# Patient Record
Sex: Male | Born: 1965
Health system: Southern US, Community
[De-identification: ages and names within clinical notes are randomized; demographics above are authoritative.]

## PROBLEM LIST (undated history)

## (undated) DIAGNOSIS — N189 Chronic kidney disease, unspecified: Secondary | ICD-10-CM

## (undated) DIAGNOSIS — N200 Calculus of kidney: Secondary | ICD-10-CM

## (undated) HISTORY — PX: ARM WOUND REPAIR / CLOSURE: SUR1141

## (undated) HISTORY — PX: FRACTURE SURGERY: SHX138

## (undated) HISTORY — DX: Calculus of kidney: N20.0

---

## 1999-01-31 ENCOUNTER — Encounter: Payer: Self-pay | Admitting: Emergency Medicine

## 1999-01-31 ENCOUNTER — Emergency Department (HOSPITAL_COMMUNITY): Admission: EM | Admit: 1999-01-31 | Discharge: 1999-01-31 | Payer: Self-pay | Admitting: Emergency Medicine

## 2006-02-10 ENCOUNTER — Ambulatory Visit: Payer: Self-pay | Admitting: Internal Medicine

## 2006-02-11 ENCOUNTER — Inpatient Hospital Stay: Payer: Self-pay | Admitting: Urology

## 2006-07-16 ENCOUNTER — Emergency Department: Payer: Self-pay | Admitting: Internal Medicine

## 2006-07-26 ENCOUNTER — Inpatient Hospital Stay: Payer: Self-pay | Admitting: Urology

## 2006-07-29 ENCOUNTER — Ambulatory Visit: Payer: Self-pay | Admitting: Urology

## 2007-08-11 ENCOUNTER — Ambulatory Visit: Payer: Self-pay | Admitting: Family Medicine

## 2007-12-17 IMAGING — CR DG ABDOMEN 1V
1 series · 1 of 1 positions shown · non-contrast
Comparison: none

REASON FOR EXAM: Kidney stones
COMMENTS:

[view not recorded]
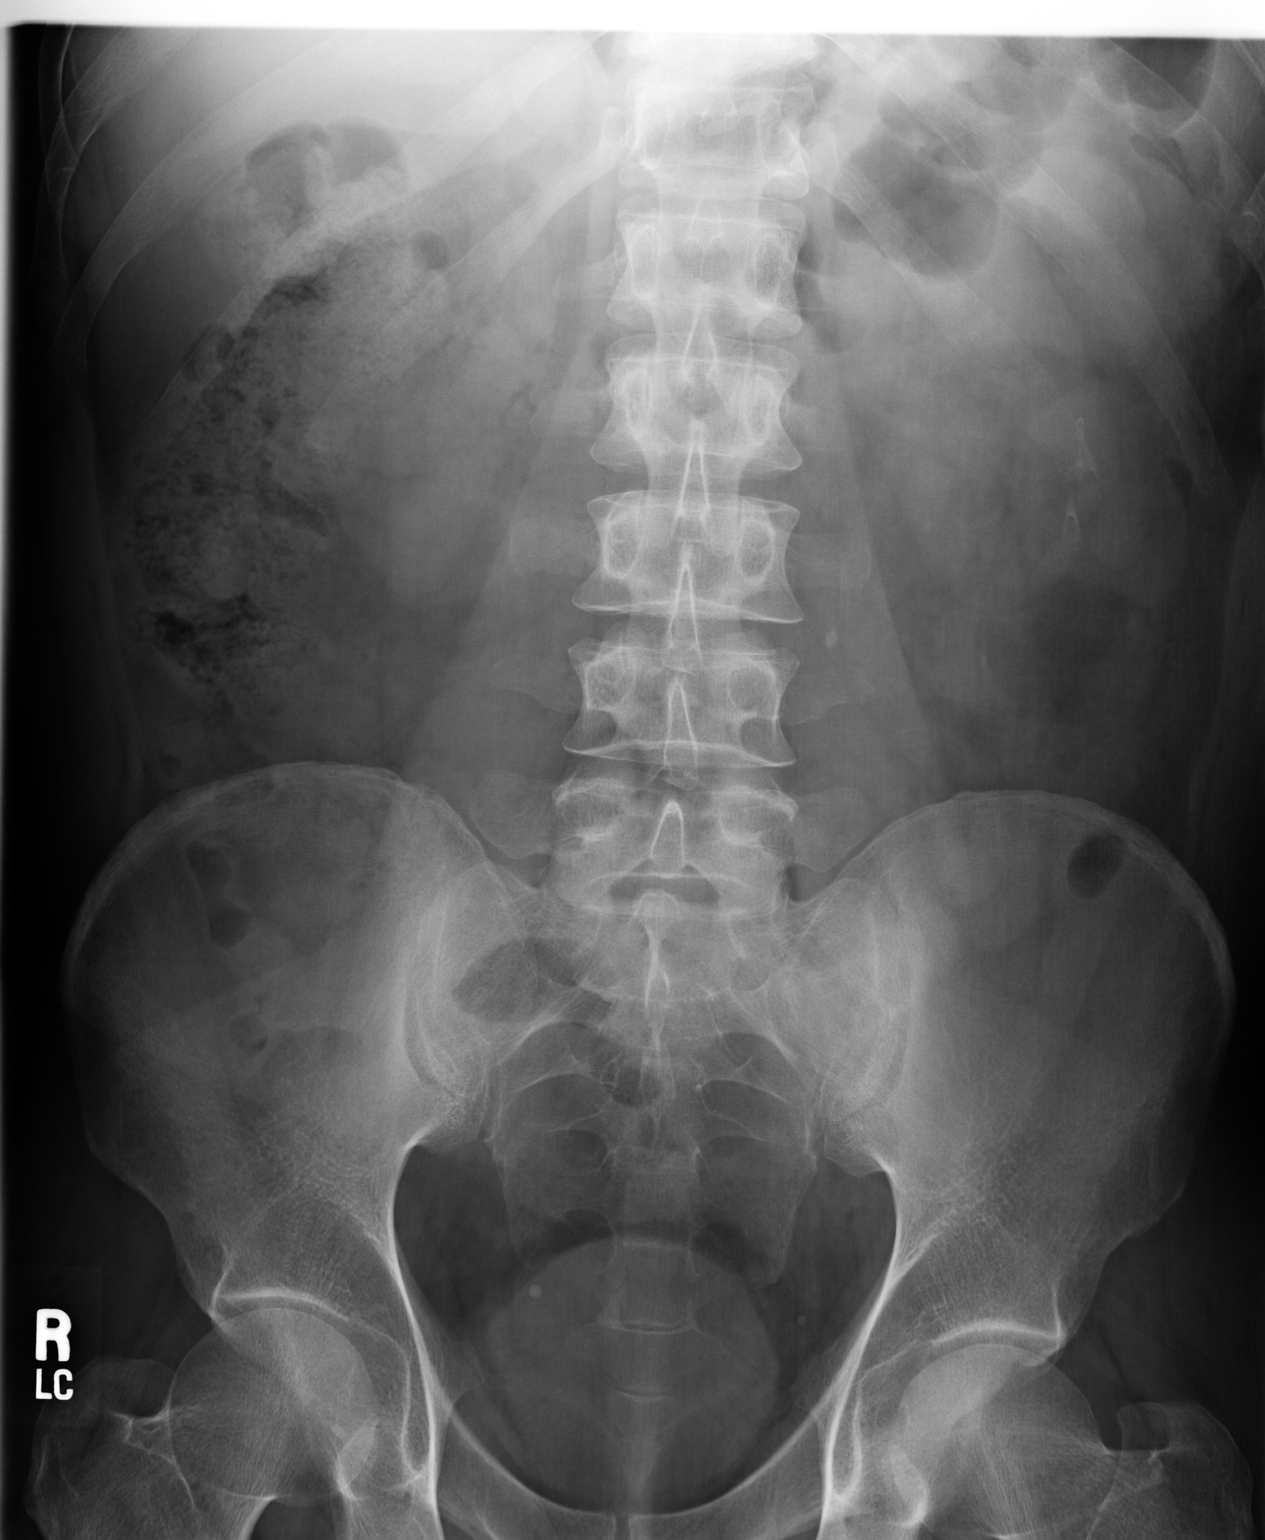

[1 of 1 positions shown; findings below may reference images not displayed]

PROCEDURE:     DXR - DXR KIDNEY URETER BLADDER  - July 29, 2006  [DATE]

RESULT:     Comparison is made to the prior exam of 07/26/2006. There is
again noted to a 3 mm calcification which on this exam is projected inferior
to the L3 transverse process on the left and therefore is slightly lower in
position than on the prior exam. No additional renal or ureteral
calcifications are seen. There is again noted a round calcification in the
right pelvis consistent with a phlebolith.
IMPRESSION: Please see above.

## 2008-12-25 ENCOUNTER — Observation Stay: Payer: Self-pay | Admitting: Urology

## 2009-06-01 HISTORY — PX: OTHER SURGICAL HISTORY: SHX169

## 2010-11-04 ENCOUNTER — Emergency Department: Payer: Self-pay | Admitting: Emergency Medicine

## 2015-05-02 ENCOUNTER — Ambulatory Visit
Admission: RE | Admit: 2015-05-02 | Discharge: 2015-05-02 | Disposition: A | Discharge status: Home / Self Care | Payer: BLUE CROSS/BLUE SHIELD | Source: Ambulatory Visit | Attending: Urology | Admitting: Urology

## 2015-05-02 ENCOUNTER — Encounter: Payer: Self-pay | Admitting: *Deleted

## 2015-05-02 ENCOUNTER — Encounter
Admission: RE | Disposition: A | Discharge status: Home / Self Care | Payer: Self-pay | Source: Ambulatory Visit | Attending: Urology

## 2015-05-02 DIAGNOSIS — Z87442 Personal history of urinary calculi: Secondary | ICD-10-CM | POA: Insufficient documentation

## 2015-05-02 DIAGNOSIS — Z9889 Other specified postprocedural states: Secondary | ICD-10-CM | POA: Insufficient documentation

## 2015-05-02 DIAGNOSIS — Z8249 Family history of ischemic heart disease and other diseases of the circulatory system: Secondary | ICD-10-CM | POA: Insufficient documentation

## 2015-05-02 DIAGNOSIS — N200 Calculus of kidney: Secondary | ICD-10-CM

## 2015-05-02 DIAGNOSIS — Z833 Family history of diabetes mellitus: Secondary | ICD-10-CM | POA: Diagnosis not present

## 2015-05-02 DIAGNOSIS — Z79899 Other long term (current) drug therapy: Secondary | ICD-10-CM | POA: Diagnosis not present

## 2015-05-02 HISTORY — PX: EXTRACORPOREAL SHOCK WAVE LITHOTRIPSY: SHX1557

## 2015-05-02 SURGERY — LITHOTRIPSY, ESWL
Anesthesia: Moderate Sedation | Laterality: Right

## 2015-05-02 MED ORDER — SODIUM CHLORIDE 0.9 % IJ SOLN
INTRAMUSCULAR | Status: AC
  Filled 2015-05-02: qty 10

## 2015-05-02 MED ORDER — PROMETHAZINE HCL 25 MG/ML IJ SOLN
INTRAMUSCULAR | Status: AC
  Administered 2015-05-02: 12.5 mg via INTRAVENOUS
  Filled 2015-05-02: qty 1

## 2015-05-02 MED ORDER — PROMETHAZINE HCL 25 MG/ML IJ SOLN
25.0000 mg | Freq: Once | INTRAMUSCULAR | Status: AC
  Administered 2015-05-02: 25 mg via INTRAMUSCULAR

## 2015-05-02 MED ORDER — HYDROMORPHONE HCL 1 MG/ML IJ SOLN
INTRAMUSCULAR | Status: AC
  Administered 2015-05-02: 1 mg via INTRAVENOUS
  Filled 2015-05-02: qty 1

## 2015-05-02 MED ORDER — MIDAZOLAM HCL 2 MG/2ML IJ SOLN
INTRAMUSCULAR | Status: AC
  Filled 2015-05-02: qty 2

## 2015-05-02 MED ORDER — MORPHINE SULFATE (PF) 10 MG/ML IV SOLN
10.0000 mg | Freq: Once | INTRAVENOUS | Status: AC
  Administered 2015-05-02: 10 mg via INTRAMUSCULAR

## 2015-05-02 MED ORDER — DEXTROSE-NACL 5-0.45 % IV SOLN
Freq: Once | INTRAVENOUS | Status: AC
  Administered 2015-05-02: 10:00:00 via INTRAVENOUS

## 2015-05-02 MED ORDER — MORPHINE SULFATE (PF) 10 MG/ML IV SOLN
INTRAVENOUS | Status: AC
  Filled 2015-05-02: qty 1

## 2015-05-02 MED ORDER — HYDROMORPHONE HCL 1 MG/ML IJ SOLN
1.0000 mg | Freq: Once | INTRAMUSCULAR | Status: AC
  Administered 2015-05-02: 1 mg via INTRAVENOUS

## 2015-05-02 MED ORDER — PROMETHAZINE HCL 25 MG/ML IJ SOLN
12.5000 mg | Freq: Once | INTRAMUSCULAR | Status: AC
  Administered 2015-05-02: 12.5 mg via INTRAVENOUS

## 2015-05-02 MED ORDER — TAMSULOSIN HCL 0.4 MG PO CAPS: 0.4000 mg | ORAL_CAPSULE | Freq: Every day | ORAL | Status: DC

## 2015-05-02 MED ORDER — LEVOFLOXACIN 500 MG PO TABS: 500.0000 mg | ORAL_TABLET | Freq: Every day | ORAL | Status: DC

## 2015-05-02 MED ORDER — DIPHENHYDRAMINE HCL 25 MG PO CAPS
25.0000 mg | ORAL_CAPSULE | ORAL | Status: AC
  Administered 2015-05-02: 25 mg via ORAL

## 2015-05-02 MED ORDER — FUROSEMIDE 10 MG/ML IJ SOLN
INTRAMUSCULAR | Status: AC
  Administered 2015-05-02: 10 mg via INTRAVENOUS
  Filled 2015-05-02: qty 2

## 2015-05-02 MED ORDER — FUROSEMIDE 10 MG/ML IJ SOLN
10.0000 mg | Freq: Once | INTRAMUSCULAR | Status: AC
  Administered 2015-05-02: 10 mg via INTRAVENOUS

## 2015-05-02 MED ORDER — ONDANSETRON 8 MG PO TBDP: 8.0000 mg | ORAL_TABLET | Freq: Four times a day (QID) | ORAL | Status: DC | PRN

## 2015-05-02 MED ORDER — DIPHENHYDRAMINE HCL 25 MG PO CAPS
ORAL_CAPSULE | ORAL | Status: AC
  Filled 2015-05-02: qty 1

## 2015-05-02 MED ORDER — LEVOFLOXACIN 500 MG PO TABS
500.0000 mg | ORAL_TABLET | Freq: Once | ORAL | Status: AC
  Administered 2015-05-02: 500 mg via ORAL

## 2015-05-02 MED ORDER — MIDAZOLAM HCL 2 MG/2ML IJ SOLN
1.0000 mg | Freq: Once | INTRAMUSCULAR | Status: AC
  Administered 2015-05-02: 1 mg via INTRAMUSCULAR

## 2015-05-02 MED ORDER — PROMETHAZINE HCL 25 MG/ML IJ SOLN
INTRAMUSCULAR | Status: AC
  Filled 2015-05-02: qty 1

## 2015-05-02 MED ORDER — NUCYNTA 50 MG PO TABS: 50.0000 mg | ORAL_TABLET | Freq: Four times a day (QID) | ORAL | Status: DC | PRN

## 2015-05-02 MED ORDER — LEVOFLOXACIN 500 MG PO TABS
ORAL_TABLET | ORAL | Status: AC
  Filled 2015-05-02: qty 1

## 2015-05-02 NOTE — Progress Notes (Signed)
Pt getting ready to go home with repeat N/V and right flank stabbing pain but states is somewhat better. Dr Yves Dill aware with orders for phenergan and dilaudid given.

## 2015-05-02 NOTE — Discharge Instructions (Addendum)
Dietary Guidelines to Help Prevent Kidney Stones °Your risk of kidney stones can be decreased by adjusting the foods you eat. The most important thing you can do is drink enough fluid. You should drink enough fluid to keep your urine clear or pale yellow. The following guidelines provide specific information for the type of kidney stone you have had. °GUIDELINES ACCORDING TO TYPE OF KIDNEY STONE °Calcium Oxalate Kidney Stones °· Reduce the amount of salt you eat. Foods that have a lot of salt cause your body to release excess calcium into your urine. The excess calcium can combine with a substance called oxalate to form kidney stones. °· Reduce the amount of animal protein you eat if the amount you eat is excessive. Animal protein causes your body to release excess calcium into your urine. Ask your dietitian how much protein from animal sources you should be eating. °· Avoid foods that are high in oxalates. If you take vitamins, they should have less than 500 mg of vitamin C. Your body turns vitamin C into oxalates. You do not need to avoid fruits and vegetables high in vitamin C. °Calcium Phosphate Kidney Stones °· Reduce the amount of salt you eat to help prevent the release of excess calcium into your urine. °· Reduce the amount of animal protein you eat if the amount you eat is excessive. Animal protein causes your body to release excess calcium into your urine. Ask your dietitian how much protein from animal sources you should be eating. °· Get enough calcium from food or take a calcium supplement (ask your dietitian for recommendations). Food sources of calcium that do not increase your risk of kidney stones include: °· Broccoli. °· Dairy products, such as cheese and yogurt. °· Pudding. °Uric Acid Kidney Stones °· Do not have more than 6 oz of animal protein per day. °FOOD SOURCES °Animal Protein Sources °· Meat (all types). °· Poultry. °· Eggs. °· Fish, seafood. °Foods High in Salt °· Salt seasonings. °· Soy  sauce. °· Teriyaki sauce. °· Cured and processed meats. °· Salted crackers and snack foods. °· Fast food. °· Canned soups and most canned foods. °Foods High in Oxalates °· Grains: °· Amaranth. °· Barley. °· Grits. °· Wheat germ. °· Bran. °· Buckwheat flour. °· All bran cereals. °· Pretzels. °· Whole wheat bread. °· Vegetables: °· Beans (wax). °· Beets and beet greens. °· Collard greens. °· Eggplant. °· Escarole. °· Leeks. °· Okra. °· Parsley. °· Rutabagas. °· Spinach. °· Swiss chard. °· Tomato paste. °· Fried potatoes. °· Sweet potatoes. °· Fruits: °· Red currants. °· Figs. °· Kiwi. °· Rhubarb. °· Meat and Other Protein Sources: °· Beans (dried). °· Soy burgers and other soybean products. °· Miso. °· Nuts (peanuts, almonds, pecans, cashews, hazelnuts). °· Nut butters. °· Sesame seeds and tahini (paste made of sesame seeds). °· Poppy seeds. °· Beverages: °· Chocolate drink mixes. °· Soy milk. °· Instant iced tea. °· Juices made from high-oxalate fruits or vegetables. °· Other: °· Carob. °· Chocolate. °· Fruitcake. °· Marmalades. °  °This information is not intended to replace advice given to you by your health care provider. Make sure you discuss any questions you have with your health care provider. °  °Document Released: 09/12/2010 Document Revised: 05/23/2013 Document Reviewed: 04/14/2013 °Elsevier Interactive Patient Education ©2016 Elsevier Inc. ° °Kidney Stones °Kidney stones (urolithiasis) are deposits that form inside your kidneys. The intense pain is caused by the stone moving through the urinary tract. When the stone moves, the ureter   goes into spasm around the stone. The stone is usually passed in the urine.  °CAUSES  °· A disorder that makes certain neck glands produce too much parathyroid hormone (primary hyperparathyroidism). °· A buildup of uric acid crystals, similar to gout in your joints. °· Narrowing (stricture) of the ureter. °· A kidney obstruction present at birth (congenital  obstruction). °· Previous surgery on the kidney or ureters. °· Numerous kidney infections. °SYMPTOMS  °· Feeling sick to your stomach (nauseous). °· Throwing up (vomiting). °· Blood in the urine (hematuria). °· Pain that usually spreads (radiates) to the groin. °· Frequency or urgency of urination. °DIAGNOSIS  °· Taking a history and physical exam. °· Blood or urine tests. °· CT scan. °· Occasionally, an examination of the inside of the urinary bladder (cystoscopy) is performed. °TREATMENT  °· Observation. °· Increasing your fluid intake. °· Extracorporeal shock wave lithotripsy--This is a noninvasive procedure that uses shock waves to break up kidney stones. °· Surgery may be needed if you have severe pain or persistent obstruction. There are various surgical procedures. Most of the procedures are performed with the use of small instruments. Only small incisions are needed to accommodate these instruments, so recovery time is minimized. °The size, location, and chemical composition are all important variables that will determine the proper choice of action for you. Talk to your health care provider to better understand your situation so that you will minimize the risk of injury to yourself and your kidney.  °HOME CARE INSTRUCTIONS  °· Drink enough water and fluids to keep your urine clear or pale yellow. This will help you to pass the stone or stone fragments. °· Strain all urine through the provided strainer. Keep all particulate matter and stones for your health care provider to see. The stone causing the pain may be as small as a grain of salt. It is very important to use the strainer each and every time you pass your urine. The collection of your stone will allow your health care provider to analyze it and verify that a stone has actually passed. The stone analysis will often identify what you can do to reduce the incidence of recurrences. °· Only take over-the-counter or prescription medicines for pain,  discomfort, or fever as directed by your health care provider. °· Keep all follow-up visits as told by your health care provider. This is important. °· Get follow-up X-rays if required. The absence of pain does not always mean that the stone has passed. It may have only stopped moving. If the urine remains completely obstructed, it can cause loss of kidney function or even complete destruction of the kidney. It is your responsibility to make sure X-rays and follow-ups are completed. Ultrasounds of the kidney can show blockages and the status of the kidney. Ultrasounds are not associated with any radiation and can be performed easily in a matter of minutes. °· Make changes to your daily diet as told by your health care provider. You may be told to: °· Limit the amount of salt that you eat. °· Eat 5 or more servings of fruits and vegetables each day. °· Limit the amount of meat, poultry, fish, and eggs that you eat. °· Collect a 24-hour urine sample as told by your health care provider. You may need to collect another urine sample every 6-12 months. °SEEK MEDICAL CARE IF: °· You experience pain that is progressive and unresponsive to any pain medicine you have been prescribed. °SEEK IMMEDIATE MEDICAL CARE IF:  °· Pain   cannot be controlled with the prescribed medicine. °· You have a fever or shaking chills. °· The severity or intensity of pain increases over 18 hours and is not relieved by pain medicine. °· You develop a new onset of abdominal pain. °· You feel faint or pass out. °· You are unable to urinate. °  °This information is not intended to replace advice given to you by your health care provider. Make sure you discuss any questions you have with your health care provider. °  °Document Released: 05/18/2005 Document Revised: 02/06/2015 Document Reviewed: 10/19/2012 °Elsevier Interactive Patient Education ©2016 Elsevier Inc. ° °Lithotripsy, Care After °Refer to this sheet in the next few weeks. These instructions  provide you with information on caring for yourself after your procedure. Your health care provider may also give you more specific instructions. Your treatment has been planned according to current medical practices, but problems sometimes occur. Call your health care provider if you have any problems or questions after your procedure. °WHAT TO EXPECT AFTER THE PROCEDURE  °· Your urine may have a red tinge for a few days after treatment. Blood loss is usually minimal. °· You may have soreness in the back or flank area. This usually goes away after a few days. The procedure can cause blotches or bruises on the back where the pressure wave enters the skin. These marks usually cause only minimal discomfort and should disappear in a short time. °· Stone fragments should begin to pass within 24 hours of treatment. However, a delayed passage is not unusual. °· You may have pain, discomfort, and feel sick to your stomach (nauseated) when the crushed fragments of stone are passed down the tube from the kidney to the bladder. Stone fragments can pass soon after the procedure and may last for up to 4-8 weeks. °· A small number of patients may have severe pain when stone fragments are not able to pass, which leads to an obstruction. °· If your stone is greater than 1 inch (2.5 cm) in diameter or if you have multiple stones that have a combined diameter greater than 1 inch (2.5 cm), you may require more than one treatment. °· If you had a stent placed prior to your procedure, you may experience some discomfort, especially during urination. You may experience the pain or discomfort in your flank or back, or you may experience a sharp pain or discomfort at the base of your penis or in your lower abdomen. The discomfort usually lasts only a few minutes after urinating. °HOME CARE INSTRUCTIONS  °· Rest at home until you feel your energy improving. °· Only take over-the-counter or prescription medicines for pain, discomfort, or  fever as directed by your health care provider. Depending on the type of lithotripsy, you may need to take antibiotics and anti-inflammatory medicines for a few days. °· Drink enough water and fluids to keep your urine clear or pale yellow. This helps "flush" your kidneys. It helps pass any remaining pieces of stone and prevents stones from coming back. °· Most people can resume daily activities within 1-2 days after standard lithotripsy. It can take longer to recover from laser and percutaneous lithotripsy. °· Strain all urine through the provided strainer. Keep all particulate matter and stones for your health care provider to see. The stone may be as small as a grain of salt. It is very important to use the strainer each and every time you pass your urine. Any stones that are found can be sent to   a medical lab for examination.  Visit your health care provider for a follow-up appointment in a few weeks. Your doctor may remove your stent if you have one. Your health care provider will also check to see whether stone particles still remain. SEEK MEDICAL CARE IF:   Your pain is not relieved by medicine.  You have a lasting nauseous feeling.  You feel there is too much blood in the urine.  You develop persistent problems with frequent or painful urination that does not at least partially improve after 2 days following the procedure.  You have a congested cough.  You feel lightheaded.  You develop a rash or any other signs that might suggest an allergic problem.  You develop any reaction or side effects to your medicine(s). SEEK IMMEDIATE MEDICAL CARE IF:   You experience severe back or flank pain or both.  You see nothing but blood when you urinate.  You cannot pass any urine at all.  You have a fever or shaking chills.  You develop shortness of breath, difficulty breathing, or chest pain.  You develop vomiting that will not stop after 6-8 hours.  You have a fainting episode.   This  information is not intended to replace advice given to you by your health care provider. Make sure you discuss any questions you have with your health care provider.   Document Released: 06/07/2007 Document Revised: 02/06/2015 Document Reviewed: 12/01/2012 Elsevier Interactive Patient Education [employer].  Lithotripsy Lithotripsy is a treatment that can sometimes help eliminate kidney stones and pain that they cause. A form of lithotripsy, also known as extracorporeal shock wave lithotripsy, is a nonsurgical procedure that helps your body rid itself of the kidney stone when it is too big to pass on its own. Extracorporeal shock wave lithotripsy is a method of crushing a kidney stone with shock waves. These shock waves pass through your body and are focused on your stone. They cause the kidney stones to crumble while still in the urinary tract. It is then easier for the smaller pieces of stone to pass in the urine. Lithotripsy usually takes about an hour. It is done in a hospital, a lithotripsy center, or a mobile unit. It usually does not require an overnight stay. Your health care provider will instruct you on preparation for the procedure. Your health care provider will tell you what to expect afterward. LET [facility] CARE PROVIDER KNOW ABOUT:  Any allergies you have.  All medicines you are taking, including vitamins, herbs, eye drops, creams, and over-the-counter medicines.  Previous problems you or members of your family have had with the use of anesthetics.  Any blood disorders you have.  Previous surgeries you have had.  Medical conditions you have. RISKS AND COMPLICATIONS Generally, lithotripsy for kidney stones is a safe procedure. However, as with any procedure, complications can occur. Possible complications include:  Infection.  Bleeding of the kidney.  Bruising of the kidney or skin.  Obstruction of the ureter.  Failure of the stone to fragment. BEFORE THE  PROCEDURE  Do not eat or drink for 6-8 hours prior to the procedure. You may, however, take the medications with a sip of water that your physician instructs you to take  Do not take aspirin or aspirin-containing products for 7 days prior to your procedure  Do not take nonsteroidal anti-inflammatory products for 7 days prior to your procedure PROCEDURE A stent (flexible tube with holes) may be placed in your ureter. The ureter is  the tube that transports the urine from the kidneys to the bladder. Your health care provider may place a stent before the procedure. This will help keep urine flowing from the kidney if the fragments of the stone block the ureter. You may have an IV tube placed in one of your veins to give you fluids and medicines. These medicines may help you relax or make you sleep. During the procedure, you will lie comfortably on a fluid-filled cushion or in a warm-water bath. After an X-ray or ultrasound exam to locate your stone, shock waves are aimed at the stone. If you are awake, you may feel a tapping sensation as the shock waves pass through your body. If large stone particles remain after treatment, a second procedure may be necessary at a later date. °For comfort during the test: °· Relax as much as possible. °· Try to remain still as much as possible. °· Try to follow instructions to speed up the test. °· Let your health care provider know if you are uncomfortable, anxious, or in pain. °AFTER THE PROCEDURE  °After surgery, you will be taken to the recovery area. A nurse will watch and check your progress. Once you're awake, stable, and taking fluids well, you will be allowed to go home as long as there are no problems. You will also be allowed to pass your urine before discharge. You may be given antibiotics to help prevent infection. You may also be prescribed pain medicine if needed. In a week or two, your health care provider may remove your stent, if you have one. You may first  have an X-ray exam to check on how successful the fragmentation of your stone has been and how much of the stone has passed. Your health care provider will check to see whether or not stone particles remain. °SEEK IMMEDIATE MEDICAL CARE IF: °· You develop a fever or shaking chills. °· Your pain is not relieved by medicine. °· You feel sick to your stomach (nauseated) and you vomit. °· You develop heavy bleeding. °· You have difficulty urinating. °· You start to pass your stent from your penis. °  °This information is not intended to replace advice given to you by your health care provider. Make sure you discuss any questions you have with your health care provider. °  °Document Released: 05/15/2000 Document Revised: 06/08/2014 Document Reviewed: 12/01/2012 °Elsevier Interactive Patient Education ©2016 Elsevier Inc. ° °Renal Colic °Renal colic is pain that is caused by passing a kidney stone. The pain can be sharp and severe. It may be felt in the back, abdomen, side (flank), or groin. It can cause nausea. Renal colic can come and go. °HOME CARE INSTRUCTIONS °Watch your condition for any changes. The following actions may help to lessen any discomfort that you are feeling: °· Take medicines only as directed by your health care provider. °· Ask your health care provider if it is okay to take over-the-counter pain medicine. °· Drink enough fluid to keep your urine clear or pale yellow. Drink 6-8 glasses of water each day. °· Limit the amount of salt that you eat to less than 2 grams per day. °· Reduce the amount of protein in your diet. Eat less meat, fish, nuts, and dairy. °· Avoid foods such as spinach, rhubarb, nuts, or bran. These may make kidney stones more likely to form. °SEEK MEDICAL CARE IF: °· You have a fever or chills. °· Your urine smells bad or looks cloudy. °· You have pain or   burning when you pass urine. SEEK IMMEDIATE MEDICAL CARE IF:  Your flank pain or groin pain suddenly worsens.  You become  confused or disoriented or you lose consciousness.   This information is not intended to replace advice given to you by your health care provider. Make sure you discuss any questions you have with your health care provider.   Document Released: 02/25/2005 Document Revised: 06/08/2014 Document Reviewed: 03/28/2014 Elsevier Interactive Patient Education 2016 [place]   1) The drugs that you were given will stay in your system until tomorrow so for the next 24 hours you should not:  A) Drive an automobile B) Make any legal decisions C) Drink any alcoholic beverage   2) You may resume regular meals tomorrow.  Today it is better to start with liquids and gradually work up to solid foods.  You may eat anything you prefer, but it is better to start with liquids, then soup and crackers, and gradually work up to solid foods.   3) Please notify your doctor immediately if you have any unusual bleeding, trouble breathing, redness and pain at the surgery site, drainage, fever, or pain not relieved by medication.    4) Additional Instructions:        Please contact your physician with any problems or Same Day Surgery at [phone], Monday through Friday 6 am to 4 pm, or [place] at [facility] number at [phone].AMBULATORY SURGERY  DISCHARGE INSTRUCTIONS   5) The drugs that you were given will stay in your system until tomorrow so for the next 24 hours you should not:  D) Drive an automobile E) Make any legal decisions F) Drink any alcoholic beverage   6) You may resume regular meals tomorrow.  Today it is better to start with liquids and gradually work up to solid foods.  You may eat anything you prefer, but it is better to start with liquids, then soup and crackers, and gradually work up to solid foods.   7) Please notify your doctor immediately if you have any unusual bleeding, trouble breathing, redness and pain at  the surgery site, drainage, fever, or pain not relieved by medication.    8) Additional Instructions:        Please contact your physician with any problems or Same Day Surgery at [phone], Monday through Friday 6 am to 4 pm, or [place] at [facility] number at [phone].

## 2015-05-03 ENCOUNTER — Encounter: Payer: Self-pay | Admitting: Urology

## 2015-05-06 ENCOUNTER — Encounter: Payer: Self-pay | Admitting: *Deleted

## 2015-05-06 DIAGNOSIS — N202 Calculus of kidney with calculus of ureter: Secondary | ICD-10-CM | POA: Diagnosis not present

## 2015-05-06 DIAGNOSIS — N201 Calculus of ureter: Secondary | ICD-10-CM | POA: Diagnosis present

## 2015-05-06 DIAGNOSIS — R509 Fever, unspecified: Secondary | ICD-10-CM | POA: Diagnosis not present

## 2015-05-06 NOTE — Patient Instructions (Signed)
  Your procedure is scheduled on: 05/07/15 3 PM Report to Day Surgery. [facility]ICAL [facility] SECOND FLOOR   Remember: Instructions that are not followed completely may result in serious medical risk, up to and including death, or upon the discretion of your surgeon and anesthesiologist your surgery may need to be rescheduled.    __X__ 1. Do not eat food or drink liquids after midnight. No gum chewing or hard candies.     _X___ 2. No Alcohol for 24 hours before or after surgery.   ____ 3. Bring all medications with you on the day of surgery if instructed.    _X___ 4. Notify your doctor if there is any change in your medical condition     (cold, fever, infections).     Do not wear jewelry, make-up, hairpins, clips or nail polish.  Do not wear lotions, powders, or perfumes. You may wear deodorant.  Do not shave 48 hours prior to surgery. Men may shave face and neck.  Do not bring valuables to the hospital.    [facility] is not responsible for any belongings or valuables.               Contacts, dentures or bridgework may not be worn into surgery.  Leave your suitcase in the car. After surgery it may be brought to your room.  For patients admitted to the hospital, discharge time is determined by your                treatment team.   Patients discharged the day of surgery will not be allowed to drive home.   Please read over the following fact sheets that you were given:   Surgical Site Infection Prevention   ____ Take these medicines the morning of surgery with A SIP OF WATER:    1. NONE  2.   3.   4.  5.  6.  ____ Fleet Enema (as directed)   ____ Use CHG Soap as directed  ____ Use inhalers on the day of surgery  ____ Stop metformin 2 days prior to surgery    ____ Take 1/2 of usual insulin dose the night before surgery and none on the morning of surgery.   ____ Stop Coumadin/Plavix/aspirin on  ____ Stop Anti-inflammatories on   ____ Stop supplements until after surgery.     ____ Bring C-Pap to the hospital.

## 2015-05-07 ENCOUNTER — Encounter
Admission: RE | Disposition: A | Discharge status: Home / Self Care | Payer: Self-pay | Source: Ambulatory Visit | Attending: Urology

## 2015-05-07 ENCOUNTER — Encounter: Payer: Self-pay | Admitting: *Deleted

## 2015-05-07 ENCOUNTER — Ambulatory Visit
Admission: RE | Admit: 2015-05-07 | Discharge: 2015-05-07 | Disposition: A | Discharge status: Home / Self Care | Payer: BLUE CROSS/BLUE SHIELD | Source: Ambulatory Visit | Attending: Urology | Admitting: Urology

## 2015-05-07 ENCOUNTER — Ambulatory Visit: Payer: BLUE CROSS/BLUE SHIELD | Admitting: Anesthesiology

## 2015-05-07 DIAGNOSIS — N202 Calculus of kidney with calculus of ureter: Secondary | ICD-10-CM | POA: Diagnosis not present

## 2015-05-07 DIAGNOSIS — R509 Fever, unspecified: Secondary | ICD-10-CM | POA: Insufficient documentation

## 2015-05-07 HISTORY — PX: URETEROSCOPY WITH HOLMIUM LASER LITHOTRIPSY: SHX6645

## 2015-05-07 HISTORY — DX: Chronic kidney disease, unspecified: N18.9

## 2015-05-07 SURGERY — URETEROSCOPY, WITH LITHOTRIPSY USING HOLMIUM LASER
Anesthesia: General | Site: Ureter | Laterality: Right | Wound class: Clean Contaminated

## 2015-05-07 MED ORDER — FENTANYL CITRATE (PF) 100 MCG/2ML IJ SOLN: 25.0000 ug | INTRAMUSCULAR | Status: DC | PRN

## 2015-05-07 MED ORDER — DOCUSATE SODIUM 100 MG PO CAPS: 200.0000 mg | ORAL_CAPSULE | Freq: Two times a day (BID) | ORAL | Status: DC

## 2015-05-07 MED ORDER — LIDOCAINE HCL (CARDIAC) 20 MG/ML IV SOLN
INTRAVENOUS | Status: DC | PRN
  Administered 2015-05-07: 60 mg via INTRAVENOUS

## 2015-05-07 MED ORDER — HYDROMORPHONE BOLUS VIA INFUSION
1.0000 mg | Freq: Once | INTRAVENOUS | Status: DC
  Filled 2015-05-07: qty 1

## 2015-05-07 MED ORDER — GENTAMICIN IN SALINE 1.6-0.9 MG/ML-% IV SOLN
80.0000 mg | INTRAVENOUS | Status: AC
  Administered 2015-05-07: 80 mg via INTRAVENOUS
  Filled 2015-05-07: qty 50

## 2015-05-07 MED ORDER — URIBEL 118 MG PO CAPS: 1.0000 | ORAL_CAPSULE | Freq: Four times a day (QID) | ORAL | Status: DC | PRN

## 2015-05-07 MED ORDER — HYDROMORPHONE HCL 1 MG/ML IJ SOLN
INTRAMUSCULAR | Status: AC
  Filled 2015-05-07: qty 1

## 2015-05-07 MED ORDER — PROPOFOL 10 MG/ML IV BOLUS
INTRAVENOUS | Status: DC | PRN
  Administered 2015-05-07: 160 mg via INTRAVENOUS

## 2015-05-07 MED ORDER — LIDOCAINE HCL 2 % EX GEL
CUTANEOUS | Status: AC
  Filled 2015-05-07: qty 10

## 2015-05-07 MED ORDER — SULFAMETHOXAZOLE-TRIMETHOPRIM 800-160 MG PO TABS: 1.0000 | ORAL_TABLET | Freq: Two times a day (BID) | ORAL | Status: DC

## 2015-05-07 MED ORDER — FAMOTIDINE 20 MG PO TABS
20.0000 mg | ORAL_TABLET | Freq: Once | ORAL | Status: AC
  Administered 2015-05-07: 20 mg via ORAL

## 2015-05-07 MED ORDER — KETOROLAC TROMETHAMINE 30 MG/ML IJ SOLN
30.0000 mg | Freq: Once | INTRAMUSCULAR | Status: AC
  Administered 2015-05-07: 30 mg via INTRAVENOUS

## 2015-05-07 MED ORDER — BELLADONNA ALKALOIDS-OPIUM 16.2-60 MG RE SUPP
RECTAL | Status: AC
  Filled 2015-05-07: qty 1

## 2015-05-07 MED ORDER — ONDANSETRON HCL 4 MG/2ML IJ SOLN: 4.0000 mg | Freq: Once | INTRAMUSCULAR | Status: DC | PRN

## 2015-05-07 MED ORDER — FAMOTIDINE 20 MG PO TABS
ORAL_TABLET | ORAL | Status: AC
  Administered 2015-05-07: 20 mg via ORAL
  Filled 2015-05-07: qty 1

## 2015-05-07 MED ORDER — LACTATED RINGERS IV SOLN
INTRAVENOUS | Status: DC
  Administered 2015-05-07 (×2): via INTRAVENOUS

## 2015-05-07 MED ORDER — KETOROLAC TROMETHAMINE 30 MG/ML IJ SOLN
INTRAMUSCULAR | Status: AC
  Filled 2015-05-07: qty 1

## 2015-05-07 MED ORDER — MIDAZOLAM HCL 5 MG/5ML IJ SOLN
INTRAMUSCULAR | Status: DC | PRN
  Administered 2015-05-07: 2 mg via INTRAVENOUS

## 2015-05-07 MED ORDER — HYDROMORPHONE HCL 1 MG/ML IJ SOLN
1.0000 mg | Freq: Once | INTRAMUSCULAR | Status: AC
Start: 2015-05-07 — End: 2015-05-07
  Administered 2015-05-07: 1 mg via INTRAVENOUS

## 2015-05-07 MED ORDER — FENTANYL CITRATE (PF) 100 MCG/2ML IJ SOLN
INTRAMUSCULAR | Status: DC | PRN
  Administered 2015-05-07: 50 ug via INTRAVENOUS
  Administered 2015-05-07 (×2): 25 ug via INTRAVENOUS

## 2015-05-07 SURGICAL SUPPLY — 29 items
BAG DRAIN CYSTO-URO LG1000N (MISCELLANEOUS) ×2 IMPLANT
CNTNR SPEC 2.5X3XGRAD LEK (MISCELLANEOUS)
CONRAY 43 FOR UROLOGY 50M (MISCELLANEOUS) ×1 IMPLANT
CONT SPEC 4OZ STER OR WHT (MISCELLANEOUS)
CONT SPEC 4OZ STRL OR WHT (MISCELLANEOUS)
CONTAINER SPEC 2.5X3XGRAD LEK (MISCELLANEOUS) ×1 IMPLANT
FEE RENTAL LASER STANDBY HOLM (MISCELLANEOUS) ×1 IMPLANT
FEE TECHNICIAN ONLY PER HOUR (MISCELLANEOUS) ×2 IMPLANT
GLOVE BIO SURGEON STRL SZ7 (GLOVE) ×4 IMPLANT
GLOVE BIO SURGEON STRL SZ7.5 (GLOVE) ×2 IMPLANT
GOWN STRL REUS W/ TWL LRG LVL4 (GOWN DISPOSABLE) ×1 IMPLANT
GOWN STRL REUS W/TWL LRG LVL4 (GOWN DISPOSABLE) ×2
GOWN STRL REUS W/TWL XL LVL4 (GOWN DISPOSABLE) ×2 IMPLANT
GUIDEWIRE STR ZIPWIRE 035X150 (MISCELLANEOUS) ×2 IMPLANT
KIT RM TURNOVER CYSTO AR (KITS) ×2 IMPLANT
LASER HOLMIUM FIBER SU 272UM (MISCELLANEOUS) ×1 IMPLANT
LASER HOLMIUM STANDBY (MISCELLANEOUS) IMPLANT
LASER HOLMIUM SU 940UM (MISCELLANEOUS) ×1 IMPLANT
PACK CYSTO AR (MISCELLANEOUS) ×2 IMPLANT
PREP PVP WINGED SPONGE (MISCELLANEOUS) ×2 IMPLANT
SET CYSTO W/LG BORE CLAMP LF (SET/KITS/TRAYS/PACK) ×2 IMPLANT
SOL .9 NS 3000ML IRR  AL (IV SOLUTION) ×1
SOL .9 NS 3000ML IRR AL (IV SOLUTION) ×1
SOL .9 NS 3000ML IRR UROMATIC (IV SOLUTION) ×1 IMPLANT
SOL PREP PVP 2OZ (MISCELLANEOUS) ×2
SOLUTION PREP PVP 2OZ (MISCELLANEOUS) ×1 IMPLANT
STENT URETL 6X26 FLEX (Stent) ×1 IMPLANT
SURGILUBE 2OZ TUBE FLIPTOP (MISCELLANEOUS) ×2 IMPLANT
WATER STERILE IRR 1000ML POUR (IV SOLUTION) ×2 IMPLANT

## 2015-05-07 NOTE — Anesthesia Procedure Notes (Signed)
Procedure Name: LMA Insertion Date/Time: 05/07/2015 5:43 PM Performed by: Dionne Bucy Pre-anesthesia Checklist: Patient identified, Patient being monitored, Timeout performed, Emergency Drugs available and Suction available Patient Re-evaluated:Patient Re-evaluated prior to inductionOxygen Delivery Method: Circle system utilized Preoxygenation: Pre-oxygenation with 100% oxygen Intubation Type: IV induction Ventilation: Mask ventilation without difficulty LMA: LMA inserted LMA Size: 5.0 Tube type: Oral Number of attempts: 1 Placement Confirmation: positive ETCO2 and breath sounds checked- equal and bilateral Tube secured with: Tape Dental Injury: Teeth and Oropharynx as per pre-operative assessment

## 2015-05-07 NOTE — Op Note (Signed)
Preoperative diagnosis: Right ureterolithiasis Postoperative diagnosis: Same  Procedure: 1. Right ureteroscopic ureterolithotomy with holmium laser                           Lithotripsy                      2. Right double pigtail stent placement                      3. Fluoroscopy                Surgeon: Otelia Limes. Yves Dill MD, FACS Anesthesia: Gen.  Indications:See the history and physical. After informed consent the above procedure(s) were requested     Technique and findings: After adequate general anesthesia been obtained patient was placed into dorsal lithotomy position and the perineum was prepped and draped in the usual fashion. Fluoroscopy confirmed presence of a 3 x 4 mm distal right ureteral stone. The mini rigid ureteroscope was coupled to the camera and visually advanced into the bladder. The edge of the stone was seen crowning the ureteral orifice on the right. The 365  holmium laser fiber was introduced through the scope and the stone was fragmented. There was some edema of the distal ureter. The scope was easily advanced into the mid ureter and no other stones were identified. At this point a 0.035 Glidewire was advanced through the ureteroscope and passed up the ureter. The ureteroscope was removed taking care leave the guidewire in position. The cystoscope was backloaded over the guidewire. A 6 x 26 cm pigtail stent with suture attached distally was advanced over the guidewire and positioned in the ureter under fluoroscopic guidance. The guidewire was then removed taking care leave the stent in position. The bladder was then drained and the cystoscope was removed. 10 cc of viscous Xylocaine was instilled within the urethra and the bladder. A B&O suppository was placed. The procedure was then terminated and the patient's transferred to the recovery room in stable condition.

## 2015-05-07 NOTE — Transfer of Care (Signed)
Immediate Anesthesia Transfer of Care Note  Patient: Michael York  Procedure(s) Performed: Procedure(s): URETEROSCOPY WITH HOLMIUM LASER LITHOTRIPSY (Right)  Patient Location: PACU  Anesthesia Type:General  Level of Consciousness: awake, oriented and patient cooperative  Airway & Oxygen Therapy: Patient Spontanous Breathing and Patient connected to face mask oxygen  Post-op Assessment: Report given to RN and Post -op Vital signs reviewed and stable  Post vital signs: Reviewed and stable  Last Vitals:  Filed Vitals:   05/07/15 1513 05/07/15 1714  BP: 148/97   Pulse: 103 93  Temp: 36.9 C   Resp: 18     Complications: No apparent anesthesia complications

## 2015-05-07 NOTE — Anesthesia Preprocedure Evaluation (Signed)
Anesthesia Evaluation  Patient identified by MRN, date of birth, ID band Patient awake    Reviewed: Allergy & Precautions, NPO status , Patient's Chart, lab work & pertinent test results  Airway Mallampati: II       Dental  (+) Teeth Intact   Pulmonary neg pulmonary ROS,    Pulmonary exam normal        Cardiovascular negative cardio ROS   Rhythm:Regular Rate:Normal     Neuro/Psych    GI/Hepatic negative GI ROS, Neg liver ROS,   Endo/Other    Renal/GU negative Renal ROS     Musculoskeletal negative musculoskeletal ROS (+)   Abdominal Normal abdominal exam  (+)   Peds negative pediatric ROS (+)  Hematology negative hematology ROS (+)   Anesthesia Other Findings   Reproductive/Obstetrics                             Anesthesia Physical Anesthesia Plan  ASA: II  Anesthesia Plan: General   Post-op Pain Management:    Induction: Intravenous  Airway Management Planned: LMA  Additional Equipment:   Intra-op Plan:   Post-operative Plan: Extubation in OR  Informed Consent: I have reviewed the patients History and Physical, chart, labs and discussed the procedure including the risks, benefits and alternatives for the proposed anesthesia with the patient or authorized representative who has indicated his/her understanding and acceptance.     Plan Discussed with: CRNA  Anesthesia Plan Comments:         Anesthesia Quick Evaluation

## 2015-05-07 NOTE — Discharge Instructions (Addendum)
Ureteral Stent Implantation Ureteral stent implantation is the implantation of a soft plastic tube with multiple holes into the tube that drains urine from your kidney to your bladder (ureter). The stent helps drain your kidney when there is a blockage of the flow of urine in your ureter. The stent has a coil on each end to keep it from falling out. One end stays in the kidney. The other end stays in the bladder. It is most often taken out after any blockage has been removed or your ureter has healed. Short-term stents have a string attached to make removal quite easy. Removal of a short-term stent can be done in your health care provider's office or by you at home. Long-term stents need to be changed every few months. LET [facility],The CARE PROVIDER KNOW ABOUT:  Any allergies you have.  All medicines you are taking, including vitamins, herbs, eye drops, creams, and over-the-counter medicines.  Previous problems you or members of your family have had with the use of anesthetics.  Any blood disorders you have.  Previous surgeries you have had.  Medical conditions you have. RISKS AND COMPLICATIONS Generally, ureteral stent implantation is a safe procedure. However, as with any procedure, complications can occur. Possible complications include:  Movement of the stent away from where it was originally placed (migration). This may affect the ability of the stent to properly drain your kidney. If migration of the stent occurs, the stent may need to be replaced or repositioned.  Perforation of the ureter.  Infection. BEFORE THE PROCEDURE  You may be asked to wash your genital area with sterile soap the morning of your procedure.  You may be given an oral antibiotic which you should take with a sip of water as prescribed by your health care provider.  You may be asked to not eat or drink for 8 hours before the surgery. PROCEDURE  First you will be given an anesthetic so you do not feel pain  during the procedure.  Your health care provider will insert a special lighted instrument called a cystoscope into your bladder. This allows your health care provider to see the opening to your ureter.  A thin wire is carefully threaded into your bladder and up the ureter. The stent is inserted over the wire and the wire is then removed.  Your bladder will be emptied of urine. AFTER THE PROCEDURE You will be taken to a recovery room until it is okay for you to go home.   This information is not intended to replace advice given to you by your health care provider. Make sure you discuss any questions you have with your health care provider.   Document Released: 05/15/2000 Document Revised: 05/23/2013 Document Reviewed: 11/30/2014 Elsevier Interactive Patient Education 2016 [employer]       Ureteral Stent Implantation, Care After Refer to this sheet in the next few weeks. These instructions provide you with information on caring for yourself after your procedure. Your health care provider may also give you more specific instructions. Your treatment has been planned according to current medical practices, but problems sometimes occur. Call your health care provider if you have any problems or questions after your procedure. WHAT TO EXPECT AFTER THE PROCEDURE You should be back to normal activity within 48 hours after the procedure. Nausea and vomiting may occur and are commonly the result of anesthesia. It is common to experience sharp pain in the back or lower abdomen and penis with voiding. This is caused  by movement of the ends of the stent with the act of urinating.It usually goes away within minutes after you have stopped urinating. HOME CARE INSTRUCTIONS Make sure to drink plenty of fluids. You may have small amounts of bleeding, causing your urine to be red. This is normal. Certain movements may trigger pain or a feeling that you need to urinate. You may be given medicines to  prevent infection or bladder spasms. Be sure to take all medicines as directed. Only take over-the-counter or prescription medicines for pain, discomfort, or fever as directed by your health care provider. Do not take aspirin, as this can make bleeding worse. Your stent will be left in until the blockage is resolved. This may take 2 weeks or longer, depending on the reason for stent implantation. You may have an X-ray exam to make sure your ureter is open and that the stent has not moved out of position (migrated). The stent can be removed by your health care provider in the office. Medicines may be given for comfort while the stent is being removed. Be sure to keep all follow-up appointments so your health care provider can check that you are healing properly. SEEK MEDICAL CARE IF:  You experience increasing pain.  Your pain medicine is not working. SEEK IMMEDIATE MEDICAL CARE IF:  Your urine is dark red or has blood clots.  You are leaking urine (incontinent).  You have a fever, chills, feeling sick to your stomach (nausea), or vomiting.  Your pain is not relieved by pain medicine.  The end of the stent comes out of the urethra.  You are unable to urinate.   This information is not intended to replace advice given to you by your health care provider. Make sure you discuss any questions you have with your health care provider.   Document Released: 01/18/2013 Document Revised: 05/23/2013 Document Reviewed: 11/30/2014 Elsevier Interactive Patient Education 2016 [place]   1) The drugs that you were given will stay in your system until tomorrow so for the next 24 hours you should not:  A) Drive an automobile B) Make any legal decisions C) Drink any alcoholic beverage   2) You may resume regular meals tomorrow.  Today it is better to start with liquids and gradually work up to solid foods.  You may eat anything you prefer,  but it is better to start with liquids, then soup and crackers, and gradually work up to solid foods.   3) Please notify your doctor immediately if you have any unusual bleeding, trouble breathing, redness and pain at the surgery site, drainage, fever, or pain not relieved by medication. 4)   5) Your post-operative visit with Dr.                                     is: Date:                        Time:    Please call to schedule your post-operative visit.  6) Additional Instructions: 7)

## 2015-05-07 NOTE — H&P (Signed)
Date of Initial H&P: 05/06/15  History reviewed, patient examined, no change in status, stable for surgery.

## 2015-05-07 NOTE — Anesthesia Postprocedure Evaluation (Signed)
Anesthesia Post Note  Patient: Michael York  Procedure(s) Performed: Procedure(s) (LRB): URETEROSCOPY WITH HOLMIUM LASER LITHOTRIPSY (Right)  Patient location during evaluation: PACU Anesthesia Type: General Level of consciousness: awake and alert Pain management: satisfactory to patient Vital Signs Assessment: post-procedure vital signs reviewed and stable Respiratory status: respiratory function stable Cardiovascular status: stable Anesthetic complications: no    Last Vitals:  Filed Vitals:   05/07/15 1858 05/07/15 1911  BP: 137/93 143/95  Pulse: 104   Temp:  36.5 C  Resp: 14     Last Pain:  Filed Vitals:   05/07/15 1911  PainSc: 0-No pain                 VAN STAVEREN,Loi Rennaker

## 2015-05-07 NOTE — H&P (Signed)
NAMEDENSON, JULIAN.:  [id number]  MEDICAL RECORD NO.:  [id number]  LOCATION:  PERIO                        FACILITY:  [facility]  PHYSICIAN:  Maryan Puls          DATE OF BIRTH:  February 10, 1966  DATE OF ADMISSION:  05/07/2015 DATE OF DISCHARGE:                            HISTORY AND PHYSICAL   Same day surgery, December 6.  CHIEF COMPLAINT:  Flank pain.  HISTORY OF PRESENT ILLNESS:  Mr. Viegas is a 49 year old, Caucasian male with right nephrolithiasis due to a 6 x 7 mm right renal stone.  He underwent lithotripsy on 06-01-2023.  He has passed numerous fragments, but has a residual 3 mm fragment in the distal right ureter, which is causing him to have significant flank pain.  He has also developed a low- grade fever.  He comes in now for right ureteroscopic ureterolithotomy with holmium laser lithotripsy.  PAST MEDICAL HISTORY:  No drug allergies.  Current medications included Rapaflo, tramadol, and Nucynta.  PAST SURGICAL PROCEDURES:  Included sinus surgery 1980s lithotripsy 2007, 2008, and 2010.  PAST AND CURRENT MEDICAL CONDITIONS:  Recurrent kidney stones.  REVIEW OF SYSTEMS:  The patient denied chest pain, shortness of breath, diabetes, stroke or hypertension.  PHYSICAL EXAMINATION:  GENERAL:  Well-nourished white male, in no acute distress. HEENT:  Sclerae were clear.  Pupils are equal, round, and reactive to light and accommodation.  Extraocular movements were intact. NECK:  Supple.  No palpable cervical adenopathy. LUNGS:  Clear to auscultation. CARDIOVASCULAR:  Regular rhythm and rate without audible murmurs. ABDOMEN:  Soft and nontender abdomen. GU:  Deferred. RECTAL:  Deferred.  IMPRESSION:  Right ureterolithiasis with renal colic.  PLAN:  Right ureteroscopic ureterolithotomy with holmium laser lithotripsy.          ______________________________ Maryan Puls     MW/MEDQ  D:  05/06/2015  T:  05/06/2015  Job:  [id number]

## 2015-05-07 NOTE — OR Nursing (Signed)
Patient has continued to complain of pain since previous medication.  States it took the edge off his pain but it has returned.  Dr. Ermalinda Memos in to speak with patient prior to surgery.  Agreed he could have more Dilaudid prior to going to surgery.

## 2015-05-08 ENCOUNTER — Encounter: Payer: Self-pay | Admitting: Urology

## 2015-05-20 NOTE — H&P (Signed)
NAMEROSSER, SHOOK.:  [id number]  MEDICAL RECORD NO.:  [id number]  LOCATION:  PERIO                        FACILITY:  [facility]  PHYSICIAN:  Maryan Puls          DATE OF BIRTH:  1965-10-06  DATE OF ADMISSION:  05/21/2015 DATE OF DISCHARGE:                            HISTORY AND PHYSICAL   DATE OF SURGERY:  May 21, 2015.  CHIEF COMPLAINT:  Urinary stents.  HISTORY:  Michael York is a 49 year old Caucasian male with right ureteral calculus who underwent ureteroscopic ureterolithotomy with holmium laser lithotripsy, and stent placement on the right side, December 6.  He is now stone free and comes today for a stent retrieval.  PAST MEDICAL HISTORY:  ALLERGIES:  NO DRUG ALLERGIES.  CURRENT MEDICATION:  Rapaflo, tramadol, and Nucynta.  PAST SURGICAL HISTORY: 1. Sinus surgery 1980s. 2. Lithotripsy 2008, 2007, and 2010.  PAST AND CURRENT MEDICAL CONDITION:  Recurrent kidney stones.  REVIEW OF SYSTEMS:  The patient denied heart disease, chest pain, shortness of breath, diabetes, stroke, or hypertension.  PHYSICAL EXAMINATION:  GENERAL:  Well-nourished white male, in no acute distress. HEENT:  Sclerae were clear.  Pupils were equally round, reactive to light and accommodation.  Extraocular movements were intact.  NECK: Supple.  No palpable cervical adenopathy. LUNGS:  Clear to auscultation. CARDIOVASCULAR:  Regular rhythm and rate without audible murmurs. ABDOMEN:  Soft, nontender abdomen. GU AND RECTAL:  Deferred. NEUROMUSCULAR:  Alert and oriented x3.  IMPRESSION:  Right urolithiasis, status post ureteroscopic ureterolithotomy with stent placement.  PLAN:  Cystoscopy with right stent removal.          ______________________________ Maryan Puls     MW/MEDQ  D:  05/20/2015  T:  05/20/2015  Job:  [id number]

## 2015-05-21 ENCOUNTER — Encounter: Payer: Self-pay | Admitting: Anesthesiology

## 2015-05-21 ENCOUNTER — Encounter
Admission: RE | Disposition: A | Discharge status: Home / Self Care | Payer: Self-pay | Source: Ambulatory Visit | Attending: Urology

## 2015-05-21 ENCOUNTER — Ambulatory Visit
Admission: RE | Admit: 2015-05-21 | Discharge: 2015-05-21 | Disposition: A | Discharge status: Home / Self Care | Payer: BLUE CROSS/BLUE SHIELD | Source: Ambulatory Visit | Attending: Urology | Admitting: Urology

## 2015-05-21 ENCOUNTER — Ambulatory Visit: Payer: BLUE CROSS/BLUE SHIELD | Admitting: Anesthesiology

## 2015-05-21 DIAGNOSIS — I10 Essential (primary) hypertension: Secondary | ICD-10-CM | POA: Diagnosis not present

## 2015-05-21 DIAGNOSIS — Z466 Encounter for fitting and adjustment of urinary device: Secondary | ICD-10-CM | POA: Insufficient documentation

## 2015-05-21 DIAGNOSIS — Z87442 Personal history of urinary calculi: Secondary | ICD-10-CM | POA: Diagnosis not present

## 2015-05-21 HISTORY — PX: CYSTOSCOPY W/ URETERAL STENT REMOVAL: SHX1430

## 2015-05-21 SURGERY — REMOVAL, STENT, URETER, CYSTOSCOPIC
Anesthesia: General | Laterality: Right | Wound class: Clean Contaminated

## 2015-05-21 MED ORDER — KETAMINE HCL 10 MG/ML IJ SOLN
INTRAMUSCULAR | Status: DC | PRN
  Administered 2015-05-21: 20 mg via INTRAVENOUS

## 2015-05-21 MED ORDER — PROPOFOL 10 MG/ML IV BOLUS
INTRAVENOUS | Status: DC | PRN
  Administered 2015-05-21: 160 mg via INTRAVENOUS

## 2015-05-21 MED ORDER — CEFAZOLIN SODIUM 1-5 GM-% IV SOLN
INTRAVENOUS | Status: AC
  Filled 2015-05-21: qty 50

## 2015-05-21 MED ORDER — FENTANYL CITRATE (PF) 100 MCG/2ML IJ SOLN
INTRAMUSCULAR | Status: AC
  Filled 2015-05-21: qty 2

## 2015-05-21 MED ORDER — ONDANSETRON HCL 4 MG/2ML IJ SOLN
INTRAMUSCULAR | Status: DC | PRN
  Administered 2015-05-21: 4 mg via INTRAVENOUS

## 2015-05-21 MED ORDER — CEFAZOLIN SODIUM 1-5 GM-% IV SOLN
1.0000 g | Freq: Once | INTRAVENOUS | Status: AC
  Administered 2015-05-21: 1 g via INTRAVENOUS

## 2015-05-21 MED ORDER — LIDOCAINE HCL 2 % EX GEL
CUTANEOUS | Status: AC
  Filled 2015-05-21: qty 10

## 2015-05-21 MED ORDER — FENTANYL CITRATE (PF) 100 MCG/2ML IJ SOLN
25.0000 ug | INTRAMUSCULAR | Status: DC | PRN
  Administered 2015-05-21 (×4): 25 ug via INTRAVENOUS

## 2015-05-21 MED ORDER — CEFAZOLIN SODIUM-DEXTROSE 2-3 GM-% IV SOLR: 2.0000 g | Freq: Once | INTRAVENOUS | Status: DC

## 2015-05-21 MED ORDER — BELLADONNA ALKALOIDS-OPIUM 16.2-60 MG RE SUPP
RECTAL | Status: AC
  Filled 2015-05-21: qty 1

## 2015-05-21 MED ORDER — ONDANSETRON HCL 4 MG/2ML IJ SOLN
4.0000 mg | Freq: Once | INTRAMUSCULAR | Status: DC | PRN
Start: 2015-05-21 — End: 2015-05-21

## 2015-05-21 MED ORDER — LIDOCAINE HCL (CARDIAC) 20 MG/ML IV SOLN: INTRAVENOUS | Status: DC | PRN

## 2015-05-21 MED ORDER — LACTATED RINGERS IV SOLN
INTRAVENOUS | Status: DC
  Administered 2015-05-21: 13:00:00 via INTRAVENOUS

## 2015-05-21 MED ORDER — GLYCOPYRROLATE 0.2 MG/ML IJ SOLN
INTRAMUSCULAR | Status: DC | PRN
  Administered 2015-05-21: 0.2 mg via INTRAVENOUS

## 2015-05-21 MED ORDER — FENTANYL CITRATE (PF) 100 MCG/2ML IJ SOLN
INTRAMUSCULAR | Status: DC | PRN
  Administered 2015-05-21 (×2): 50 ug via INTRAVENOUS

## 2015-05-21 MED ORDER — FENTANYL CITRATE (PF) 100 MCG/2ML IJ SOLN: INTRAMUSCULAR | Status: DC | PRN

## 2015-05-21 MED ORDER — BELLADONNA ALKALOIDS-OPIUM 16.2-60 MG RE SUPP
RECTAL | Status: DC | PRN
  Administered 2015-05-21: 1 via RECTAL

## 2015-05-21 MED ORDER — LIDOCAINE HCL 2 % EX GEL
CUTANEOUS | Status: DC | PRN
  Administered 2015-05-21: 1

## 2015-05-21 MED ORDER — MIDAZOLAM HCL 2 MG/2ML IJ SOLN
INTRAMUSCULAR | Status: DC | PRN
  Administered 2015-05-21: 2 mg via INTRAVENOUS

## 2015-05-21 MED ORDER — URIBEL 118 MG PO CAPS: 1.0000 | ORAL_CAPSULE | Freq: Four times a day (QID) | ORAL | Status: DC | PRN

## 2015-05-21 SURGICAL SUPPLY — 18 items
BAG DRAIN CYSTO-URO LG1000N (MISCELLANEOUS) ×2 IMPLANT
GLOVE BIO SURGEON STRL SZ7 (GLOVE) ×4 IMPLANT
GLOVE BIO SURGEON STRL SZ7.5 (GLOVE) ×2 IMPLANT
GOWN STRL REUS W/ TWL LRG LVL3 (GOWN DISPOSABLE) ×1 IMPLANT
GOWN STRL REUS W/ TWL XL LVL3 (GOWN DISPOSABLE) ×1 IMPLANT
GOWN STRL REUS W/TWL LRG LVL3 (GOWN DISPOSABLE) ×2
GOWN STRL REUS W/TWL XL LVL3 (GOWN DISPOSABLE) ×2
GUIDEWIRE STR ZIPWIRE 035X150 (MISCELLANEOUS) ×2 IMPLANT
PACK CYSTO AR (MISCELLANEOUS) ×2 IMPLANT
PREP PVP WINGED SPONGE (MISCELLANEOUS) ×2 IMPLANT
SET CYSTO W/LG BORE CLAMP LF (SET/KITS/TRAYS/PACK) ×2 IMPLANT
SOL .9 NS 3000ML IRR  AL (IV SOLUTION) ×1
SOL .9 NS 3000ML IRR AL (IV SOLUTION) ×1
SOL .9 NS 3000ML IRR UROMATIC (IV SOLUTION) ×1 IMPLANT
SOL PREP PVP 2OZ (MISCELLANEOUS) ×2
SOLUTION PREP PVP 2OZ (MISCELLANEOUS) ×1 IMPLANT
SURGILUBE 2OZ TUBE FLIPTOP (MISCELLANEOUS) ×2 IMPLANT
WATER STERILE IRR 1000ML POUR (IV SOLUTION) ×2 IMPLANT

## 2015-05-21 NOTE — Op Note (Signed)
Preoperative diagnosis: Right ureteral stent Postoperative diagnosis: Same  Procedure: Cystoscopy with stent retrieval   Surgeon: Otelia Limes. Yves Dill MD, FACS Anesthesia: Gen.  Indications:See the history and physical. After informed consent the above procedure(s) were requested     Technique and findings: After adequate general anesthesia been obtained the patient was placed into dorsal lithotomy position and the perineum was prepped and draped in the usual fashion. The 21 French cystoscope was coupled to the camera and then visually advanced into the bladder. The stent was identified and moved with alligator forceps. 10 cc of viscous Xylocaine was instilled within the urethra. A B&O suppository was placed. Procedure was then terminated and patient transferred to the recovery room in stable condition.

## 2015-05-21 NOTE — Anesthesia Preprocedure Evaluation (Signed)
Anesthesia Evaluation  Patient identified by MRN, date of birth, ID band Patient awake    Reviewed: Allergy & Precautions, NPO status , Patient's Chart, lab work & pertinent test results, reviewed documented beta blocker date and time   Airway Mallampati: II  TM Distance: >3 FB     Dental  (+) Chipped   Pulmonary           Cardiovascular hypertension,      Neuro/Psych    GI/Hepatic   Endo/Other    Renal/GU Renal InsufficiencyRenal disease     Musculoskeletal   Abdominal   Peds  Hematology   Anesthesia Other Findings   Reproductive/Obstetrics                             Anesthesia Physical Anesthesia Plan  ASA: II  Anesthesia Plan: General   Post-op Pain Management:    Induction: Intravenous  Airway Management Planned: LMA  Additional Equipment:   Intra-op Plan:   Post-operative Plan:   Informed Consent: I have reviewed the patients History and Physical, chart, labs and discussed the procedure including the risks, benefits and alternatives for the proposed anesthesia with the patient or authorized representative who has indicated his/her understanding and acceptance.     Plan Discussed with: CRNA  Anesthesia Plan Comments:         Anesthesia Quick Evaluation

## 2015-05-21 NOTE — H&P (Signed)
Date of Initial H&P: 05/20/15  History reviewed, patient examined, no change in status, stable for surgery.

## 2015-05-21 NOTE — Transfer of Care (Signed)
Immediate Anesthesia Transfer of Care Note  Patient: Michael York  Procedure(s) Performed: Procedure(s): CYSTOSCOPY WITH STENT REMOVAL (Right)  Patient Location: PACU  Anesthesia Type:General  Level of Consciousness: awake  Airway & Oxygen Therapy: Patient Spontanous Breathing and Patient connected to face mask oxygen  Post-op Assessment: Report given to RN  Post vital signs: Reviewed  Last Vitals:  Filed Vitals:   05/21/15 1428 05/21/15 1429  BP: 131/97 131/97  Pulse: 82 89  Temp: 36.1 C 36.3 C  Resp: 8 16    Complications: No apparent anesthesia complications

## 2015-05-21 NOTE — Anesthesia Postprocedure Evaluation (Signed)
Anesthesia Post Note  Patient: Michael York  Procedure(s) Performed: Procedure(s) (LRB): CYSTOSCOPY WITH STENT REMOVAL (Right)  Patient location during evaluation: PACU Anesthesia Type: General Level of consciousness: awake Pain management: pain level controlled Vital Signs Assessment: post-procedure vital signs reviewed and stable Respiratory status: spontaneous breathing Cardiovascular status: blood pressure returned to baseline Anesthetic complications: no    Last Vitals:  Filed Vitals:   05/21/15 1505 05/21/15 1529  BP: 130/102 141/79  Pulse: 68 75  Temp: 36.2 C 36.2 C  Resp: 16 18    Last Pain:  Filed Vitals:   05/21/15 1530  PainSc: [place]

## 2015-05-21 NOTE — Anesthesia Procedure Notes (Signed)
Procedure Name: LMA Insertion Performed by: Damain Broadus Pre-anesthesia Checklist: Patient identified, Patient being monitored, Timeout performed, Emergency Drugs available and Suction available Patient Re-evaluated:Patient Re-evaluated prior to inductionOxygen Delivery Method: Circle system utilized Preoxygenation: Pre-oxygenation with 100% oxygen Intubation Type: IV induction Ventilation: Mask ventilation without difficulty LMA: LMA inserted LMA Size: 4.0 Tube type: Oral Number of attempts: 1 Placement Confirmation: positive ETCO2 and breath sounds checked- equal and bilateral Tube secured with: Tape Dental Injury: Teeth and Oropharynx as per pre-operative assessment      

## 2015-05-21 NOTE — Discharge Instructions (Addendum)
Ureteral Stent Implantation, Care After Refer to this sheet in the next few weeks. These instructions provide you with information on caring for yourself after your procedure. Your health care provider may also give you more specific instructions. Your treatment has been planned according to current medical practices, but problems sometimes occur. Call your health care provider if you have any problems or questions after your procedure. WHAT TO EXPECT AFTER THE PROCEDURE You should be back to normal activity within 48 hours after the procedure. Nausea and vomiting may occur and are commonly the result of anesthesia. It is common to experience sharp pain in the back or lower abdomen and penis with voiding. This is caused by movement of the ends of the stent with the act of urinating.It usually goes away within minutes after you have stopped urinating. HOME CARE INSTRUCTIONS Make sure to drink plenty of fluids. You may have small amounts of bleeding, causing your urine to be red. This is normal. Certain movements may trigger pain or a feeling that you need to urinate. You may be given medicines to prevent infection or bladder spasms. Be sure to take all medicines as directed. Only take over-the-counter or prescription medicines for pain, discomfort, or fever as directed by your health care provider. Do not take aspirin, as this can make bleeding worse. Your stent will be left in until the blockage is resolved. This may take 2 weeks or longer, depending on the reason for stent implantation. You may have an X-ray exam to make sure your ureter is open and that the stent has not moved out of position (migrated). The stent can be removed by your health care provider in the office. Medicines may be given for comfort while the stent is being removed. Be sure to keep all follow-up appointments so your health care provider can check that you are healing properly. SEEK MEDICAL CARE IF:  You experience increasing  pain.  Your pain medicine is not working. SEEK IMMEDIATE MEDICAL CARE IF:  Your urine is dark red or has blood clots.  You are leaking urine (incontinent).  You have a fever, chills, feeling sick to your stomach (nausea), or vomiting.  Your pain is not relieved by pain medicine.  The end of the stent comes out of the urethra.  You are unable to urinate.   This information is not intended to replace advice given to you by your health care provider. Make sure you discuss any questions you have with your health care provider.   Document Released: 01/18/2013 Document Revised: 05/23/2013 Document Reviewed: 11/30/2014 Elsevier Interactive Patient Education 2016 [place]   1) The drugs that you were given will stay in your system until tomorrow so for the next 24 hours you should not:  A) Drive an automobile B) Make any legal decisions C) Drink any alcoholic beverage   2) You may resume regular meals tomorrow.  Today it is better to start with liquids and gradually work up to solid foods.  You may eat anything you prefer, but it is better to start with liquids, then soup and crackers, and gradually work up to solid foods.   3) Please notify your doctor immediately if you have any unusual bleeding, trouble breathing, redness and pain at the surgery site, drainage, fever, or pain not relieved by medication.    4) Additional Instructions:        Please contact your physician with any problems or Same Day Surgery  at [phone], Monday through Friday 6 am to 4 pm, or [place] at [facility] number at [phone]

## 2016-03-05 ENCOUNTER — Emergency Department
Admission: EM | Admit: 2016-03-05 | Discharge: 2016-03-05 | Disposition: A | Discharge status: Home / Self Care | Payer: BLUE CROSS/BLUE SHIELD | Attending: Emergency Medicine | Admitting: Emergency Medicine

## 2016-03-05 ENCOUNTER — Emergency Department: Payer: BLUE CROSS/BLUE SHIELD

## 2016-03-05 ENCOUNTER — Encounter: Payer: Self-pay | Admitting: Emergency Medicine

## 2016-03-05 DIAGNOSIS — Z23 Encounter for immunization: Secondary | ICD-10-CM | POA: Diagnosis not present

## 2016-03-05 DIAGNOSIS — Z79899 Other long term (current) drug therapy: Secondary | ICD-10-CM | POA: Insufficient documentation

## 2016-03-05 DIAGNOSIS — Y9389 Activity, other specified: Secondary | ICD-10-CM | POA: Insufficient documentation

## 2016-03-05 DIAGNOSIS — S61112A Laceration without foreign body of left thumb with damage to nail, initial encounter: Secondary | ICD-10-CM

## 2016-03-05 DIAGNOSIS — N189 Chronic kidney disease, unspecified: Secondary | ICD-10-CM | POA: Diagnosis not present

## 2016-03-05 DIAGNOSIS — Z791 Long term (current) use of non-steroidal anti-inflammatories (NSAID): Secondary | ICD-10-CM | POA: Insufficient documentation

## 2016-03-05 DIAGNOSIS — Y929 Unspecified place or not applicable: Secondary | ICD-10-CM | POA: Insufficient documentation

## 2016-03-05 DIAGNOSIS — Y990 Civilian activity done for income or pay: Secondary | ICD-10-CM | POA: Insufficient documentation

## 2016-03-05 DIAGNOSIS — W230XXA Caught, crushed, jammed, or pinched between moving objects, initial encounter: Secondary | ICD-10-CM | POA: Diagnosis not present

## 2016-03-05 MED ORDER — CEFAZOLIN IN D5W 1 GM/50ML IV SOLN
1.0000 g | Freq: Once | INTRAVENOUS | Status: AC
  Administered 2016-03-05: 1 g via INTRAVENOUS
  Filled 2016-03-05: qty 50

## 2016-03-05 MED ORDER — CEPHALEXIN 500 MG PO CAPS: 500.0000 mg | ORAL_CAPSULE | Freq: Four times a day (QID) | ORAL | 0 refills | Status: DC

## 2016-03-05 MED ORDER — BUPIVACAINE HCL (PF) 0.5 % IJ SOLN
10.0000 mL | Freq: Once | INTRAMUSCULAR | Status: AC
  Administered 2016-03-05: 10 mL
  Filled 2016-03-05: qty 30

## 2016-03-05 MED ORDER — TETANUS-DIPHTHERIA TOXOIDS TD 5-2 LFU IM INJ
0.5000 mL | INJECTION | Freq: Once | INTRAMUSCULAR | Status: DC
  Filled 2016-03-05: qty 0.5

## 2016-03-05 MED ORDER — HYDROCODONE-ACETAMINOPHEN 5-325 MG PO TABS: 1.0000 | ORAL_TABLET | Freq: Four times a day (QID) | ORAL | 0 refills | Status: DC | PRN

## 2016-03-05 MED ORDER — TETANUS-DIPHTH-ACELL PERTUSSIS 5-2.5-18.5 LF-MCG/0.5 IM SUSP
0.5000 mL | Freq: Once | INTRAMUSCULAR | Status: AC
  Administered 2016-03-05: 0.5 mL via INTRAMUSCULAR

## 2016-03-05 MED ORDER — ONDANSETRON HCL 4 MG/2ML IJ SOLN
4.0000 mg | Freq: Once | INTRAMUSCULAR | Status: AC
  Administered 2016-03-05: 4 mg via INTRAVENOUS
  Filled 2016-03-05: qty 2

## 2016-03-05 MED ORDER — HYDROMORPHONE HCL 1 MG/ML IJ SOLN
1.0000 mg | Freq: Once | INTRAMUSCULAR | Status: AC
  Administered 2016-03-05: 1 mg via INTRAVENOUS
  Filled 2016-03-05: qty 1

## 2016-03-05 MED ORDER — CEPHALEXIN 500 MG PO CAPS: 500.0000 mg | ORAL_CAPSULE | Freq: Four times a day (QID) | ORAL | 0 refills | Status: AC

## 2016-03-05 NOTE — ED Triage Notes (Signed)
Pt presents to ED with left thumb laceration. Pt reports getting thumb caught in a joiner today. Pt states he thinks he cut the tip of his thumb off. Pt left thumbnail intact, deep laceration noted to pad of thumb with tissue missing and bone visible. Bleeding controlled at this time.

## 2016-03-05 NOTE — Discharge Instructions (Signed)
Proceed directly to Dr..Soria for follow-up as soon as possible. Do not have anything to eat or drink until she gets a chance to evaluate her thumb.

## 2016-03-05 NOTE — ED Notes (Signed)
Affected digit irrigated by Dr. Mingo Amber. Patient tolerated well. Wet to dry dressing in place.

## 2016-03-05 NOTE — ED Provider Notes (Signed)
Time Seen: Approximately 1144  I have reviewed the triage notes  Chief Complaint: Extremity Laceration   History of Present Illness: Michael York is a 50 y.o. male who is right-handed and presents with an injury to his left thumb. Patient was working with a coronary device and apparently got the tip of his thumb trapped in the [place]. The patient lacerated the anterior surface of his left thumb.   Past Medical History:  Diagnosis Date  . Chronic kidney disease    STONES    There are no active problems to display for this patient.   Past Surgical History:  Procedure Laterality Date  . CYSTOSCOPY W/ URETERAL STENT REMOVAL Right 05/21/2015   Procedure: CYSTOSCOPY WITH STENT REMOVAL;  Surgeon: Royston Cowper, MD;  Location: [facility];  Service: Urology;  Laterality: Right;  . EXTRACORPOREAL SHOCK WAVE LITHOTRIPSY Right 05/02/2015   Procedure: EXTRACORPOREAL SHOCK WAVE LITHOTRIPSY (ESWL);  Surgeon: Royston Cowper, MD;  Location: [facility];  Service: Urology;  Laterality: Right;  . plates left arm for surgery 2011 Left 2011  . URETEROSCOPY WITH HOLMIUM LASER LITHOTRIPSY Right 05/07/2015   Procedure: URETEROSCOPY WITH HOLMIUM LASER LITHOTRIPSY;  Surgeon: Royston Cowper, MD;  Location: [facility];  Service: Urology;  Laterality: Right;    Past Surgical History:  Procedure Laterality Date  . CYSTOSCOPY W/ URETERAL STENT REMOVAL Right 05/21/2015   Procedure: CYSTOSCOPY WITH STENT REMOVAL;  Surgeon: Royston Cowper, MD;  Location: [facility];  Service: Urology;  Laterality: Right;  . EXTRACORPOREAL SHOCK WAVE LITHOTRIPSY Right 05/02/2015   Procedure: EXTRACORPOREAL SHOCK WAVE LITHOTRIPSY (ESWL);  Surgeon: Royston Cowper, MD;  Location: [facility];  Service: Urology;  Laterality: Right;  . plates left arm for surgery 2011 Left 2011  . URETEROSCOPY WITH HOLMIUM LASER LITHOTRIPSY Right 05/07/2015   Procedure: URETEROSCOPY WITH HOLMIUM LASER LITHOTRIPSY;  Surgeon: Royston Cowper, MD;  Location:  [facility];  Service: Urology;  Laterality: Right;    Current Outpatient Rx  . Order #: [id number] Class: Historical Med  . Order #: [id number] Class: Print  . Order #: [id number] Class: Print  . Order #: [id number] Class: Print  . Order #: [id number] Class: Print  . Order #: [id number] Class: Print  . Order #: [id number] Class: Print  . Order #: [id number] Class: Print  . Order #: [id number] Class: Historical Med  . Order #: [id number] Class: Historical Med  . Order #: [id number] Class: Print    Allergies:  Review of patient's allergies indicates no known allergies.  Family History: No family history on file.  Social History: Social History  Substance Use Topics  . Smoking status: Never Smoker  . Smokeless tobacco: Never Used  . Alcohol use No     Review of Systems:   10 point review of systems was performed and was otherwise negative:  Constitutional: No fever Eyes: No visual disturbances ENT: No sore throat, ear pain Cardiac: No chest pain Respiratory: No shortness of breath, wheezing, or stridor Abdomen: No abdominal pain, no vomiting, No diarrhea Endocrine: No weight loss, No night sweats Extremities: No peripheral edema, cyanosis Skin: No rashes, easy bruising Neurologic: No focal weakness, trouble with speech or swollowing Urologic: No dysuria, Hematuria, or urinary frequency   Physical Exam:  ED Triage Vitals  Enc Vitals Group     BP 03/05/16 1106 (!) 150/84     Pulse Rate 03/05/16 1106 (!) 114     Resp 03/05/16 1106 20     Temp 03/05/16 1106 98.3 F (36.8 C)     Temp  Source 03/05/16 1106 Oral     SpO2 03/05/16 1106 98 %     Weight 03/05/16 1107 175 lb (79.4 kg)     Height 03/05/16 1107 5\' 10"  (1.778 m)     Head Circumference --      Peak Flow --      Pain Score 03/05/16 1108 7     Pain Loc --      Pain Edu? --      Excl. in [place]? --     General: Awake , Alert , and Oriented times 3; GCS 15 Head: Normal cephalic , atraumatic Eyes: Pupils equal , round, reactive  to light Nose/Throat: No nasal drainage, patent upper airway without erythema or exudate.  Neck: Supple, Full range of motion, No anterior adenopathy or palpable thyroid masses Lungs: Clear to ascultation without wheezes , rhonchi, or rales Heart: Regular rate, regular rhythm without murmurs , gallops , or rubs Abdomen: Soft, non tender without rebound, guarding , or rigidity; bowel sounds positive and symmetric in all 4 quadrants. No organomegaly .        Extremities: Close examination of the left thumb shows the tip to have sensory intact. He has good flexion with what appears to be normal strength at the left thumb interphalangeal joint. He has good opposition. He has a halfway subungual hematoma posterior surface of the nail. Examination of the anterior surface shows significant soft tissue deficit with some skin remaining at the distal area of the thumb.  Neurologic: normal ambulation, Motor symmetric without deficits, sensory intact Skin: warm, dry, no rashes  "Dg Finger Thumb Left  Result Date: 03/05/2016 CLINICAL DATA:  Patient with wood working accident. Laceration. Initial encounter. EXAM: LEFT THUMB 2+V COMPARISON:  None. FINDINGS: There is a nondisplaced oblique fracture through the distal aspect of the distal phalanx the thumb. Overlying soft tissue injury. Overlying bandaging material. Additionally, within the soft tissues overlying the dorsal aspect of the proximal phalanx of the thumb there is a small linear radiodensity. IMPRESSION: Oblique fracture through the distal aspect of the distal phalanx of the thumb with overlying soft tissue deformity. Linear radiodensity within the soft tissues overlying the dorsal aspect of the proximal phalanx of the thumb. Foreign body not excluded. Electronically Signed   By: Lovey Newcomer M.D.   On: 03/05/2016 12:30  "   Procedures:  The patient received a digital block so we could take a close examination of his left thumb his sensory was again  tested and he appears to have 2 point discrimination intact. The thumb was prepped and draped in sterile fashion He has blanchable skin at the distal tip. Close examination after anesthetic and block shows mostly soft tissue damage. The tendon is visible but appears to be intact. No foreign bodies were noted. The patient received copious irrigation of his wound and received a wet-to-dry dressing   ED Course: * The patient received a tetanus shot, 1 g of IV, Dilaudid and Zofran. Patient appeared to tolerate his medications well. The exploration of his wound shows again mostly soft tissue injury with the function of his thumb appearing to be intact Clinical Course     Assessment: * Soft tissue avulsion left thumb anterior surface Distal phalanx fracture    Plan:  Patient's case was reviewed with the hand surgeon Dr.Soria. She is currently in the office and we gave the patient instructions on how to get there for her to examine and see if she agrees with the current plan which  is to heal by secondary intent.            Daymon Larsen, MD 03/05/16 [phone]

## 2016-03-05 NOTE — ED Notes (Signed)
MD aware of patients unchanged pain level. No new orders at this time.

## 2019-01-08 ENCOUNTER — Other Ambulatory Visit: Payer: Self-pay

## 2019-01-08 ENCOUNTER — Emergency Department
Admission: EM | Admit: 2019-01-08 | Discharge: 2019-01-08 | Disposition: A | Discharge status: Home / Self Care | Payer: BC Managed Care – PPO | Attending: Emergency Medicine | Admitting: Emergency Medicine

## 2019-01-08 DIAGNOSIS — N200 Calculus of kidney: Secondary | ICD-10-CM | POA: Diagnosis not present

## 2019-01-08 DIAGNOSIS — Z79899 Other long term (current) drug therapy: Secondary | ICD-10-CM | POA: Diagnosis not present

## 2019-01-08 DIAGNOSIS — R1012 Left upper quadrant pain: Secondary | ICD-10-CM | POA: Diagnosis present

## 2019-01-08 LAB — CBC
HCT: 43.9 % (ref 39.0–52.0)
Hemoglobin: 15.1 g/dL (ref 13.0–17.0)
MCH: 29.7 pg (ref 26.0–34.0)
MCHC: 34.4 g/dL (ref 30.0–36.0)
MCV: 86.4 fL (ref 80.0–100.0)
Platelets: 299 10*3/uL (ref 150–400)
RBC: 5.08 MIL/uL (ref 4.22–5.81)
RDW: 14 % (ref 11.5–15.5)
WBC: 6.9 10*3/uL (ref 4.0–10.5)
nRBC: 0 % (ref 0.0–0.2)

## 2019-01-08 LAB — BASIC METABOLIC PANEL
Anion gap: 13 (ref 5–15)
BUN: 18 mg/dL (ref 6–20)
CO2: 24 mmol/L (ref 22–32)
Calcium: 9.5 mg/dL (ref 8.9–10.3)
Chloride: 105 mmol/L (ref 98–111)
Creatinine, Ser: 1.2 mg/dL (ref 0.61–1.24)
GFR calc Af Amer: >60 mL/min (ref >60)
GFR calc non Af Amer: >60 mL/min (ref >60)
Glucose, Bld: 149 mg/dL — ABNORMAL HIGH (ref 70–99)
Potassium: 3.3 mmol/L — ABNORMAL LOW (ref 3.5–5.1)
Sodium: 142 mmol/L (ref 135–145)

## 2019-01-08 LAB — URINALYSIS, COMPLETE (UACMP) WITH MICROSCOPIC
Bacteria, UA: NONE SEEN
Bilirubin Urine: NEGATIVE
Glucose, UA: NEGATIVE mg/dL
Ketones, ur: NEGATIVE mg/dL
Leukocytes,Ua: NEGATIVE
Nitrite: NEGATIVE
Protein, ur: 30 mg/dL — AB
RBC / HPF: >50 RBC/hpf — ABNORMAL HIGH (ref 0–5)
Specific Gravity, Urine: 1.028 (ref 1.005–1.030)
pH: 5 (ref 5.0–8.0)

## 2019-01-08 MED ORDER — OXYCODONE-ACETAMINOPHEN 10-325 MG PO TABS: 1.0000 | ORAL_TABLET | Freq: Four times a day (QID) | ORAL | 0 refills | Status: AC | PRN

## 2019-01-08 MED ORDER — HYDROMORPHONE HCL 1 MG/ML IJ SOLN
1.0000 mg | Freq: Once | INTRAMUSCULAR | Status: AC
  Administered 2019-01-08: 1 mg via INTRAVENOUS
  Filled 2019-01-08: qty 1

## 2019-01-08 MED ORDER — FENTANYL CITRATE (PF) 100 MCG/2ML IJ SOLN
50.0000 ug | INTRAMUSCULAR | Status: DC | PRN
  Administered 2019-01-08: 07:00:00 50 ug via INTRAVENOUS
  Filled 2019-01-08: qty 2

## 2019-01-08 MED ORDER — IBUPROFEN 600 MG PO TABS: 600.0000 mg | ORAL_TABLET | Freq: Four times a day (QID) | ORAL | 0 refills | Status: DC | PRN

## 2019-01-08 MED ORDER — SODIUM CHLORIDE 0.9 % IV BOLUS
1000.0000 mL | Freq: Once | INTRAVENOUS | Status: AC
  Administered 2019-01-08: 1000 mL via INTRAVENOUS

## 2019-01-08 MED ORDER — ONDANSETRON HCL 4 MG/2ML IJ SOLN
4.0000 mg | Freq: Once | INTRAMUSCULAR | Status: AC
  Administered 2019-01-08: 4 mg via INTRAVENOUS
  Filled 2019-01-08: qty 2

## 2019-01-08 MED ORDER — TAMSULOSIN HCL 0.4 MG PO CAPS: 0.4000 mg | ORAL_CAPSULE | Freq: Every day | ORAL | 0 refills | Status: AC

## 2019-01-08 MED ORDER — ONDANSETRON HCL 4 MG/2ML IJ SOLN
4.0000 mg | Freq: Once | INTRAMUSCULAR | Status: AC
  Administered 2019-01-08: 07:00:00 4 mg via INTRAVENOUS
  Filled 2019-01-08: qty 2

## 2019-01-08 MED ORDER — KETOROLAC TROMETHAMINE 30 MG/ML IJ SOLN
30.0000 mg | Freq: Once | INTRAMUSCULAR | Status: AC
  Administered 2019-01-08: 30 mg via INTRAVENOUS
  Filled 2019-01-08: qty 1

## 2019-01-08 NOTE — ED Triage Notes (Signed)
Pt with left flank pain since 0330. Pt states took hydrocodone for pain pta without relief. Pt states has nausea and vomiting. Pt denies fever. Pt states has been able to urinate.

## 2019-01-08 NOTE — ED Provider Notes (Signed)
[facility] Emergency Department Provider Note ____________________________________________   First MD Initiated Contact with Patient 01/08/19 [phone]     (approximate)  I have reviewed the triage vital signs and the nursing notes.   HISTORY  Chief Complaint Flank Pain    HPI Michael York is a 53 y.o. male with PMH as noted below including history of kidney stones who presents with left flank pain radiating to his left lower abdomen, acute onset approximately 4 hours ago, persistent since then, and associated with nausea and vomiting.  The patient states he took a hydrocodone earlier which gave him partial relief.  He states the pain is similar to prior kidney stones.  He has dark urine this morning.  He states some of the stones he has passed on his own, but he has also required a lithotripsy in the past.  Past Medical History:  Diagnosis Date  . Chronic kidney disease    STONES    There are no active problems to display for this patient.   Past Surgical History:  Procedure Laterality Date  . CYSTOSCOPY W/ URETERAL STENT REMOVAL Right 05/21/2015   Procedure: CYSTOSCOPY WITH STENT REMOVAL;  Surgeon: Royston Cowper, MD;  Location: [facility];  Service: Urology;  Laterality: Right;  . EXTRACORPOREAL SHOCK WAVE LITHOTRIPSY Right 05/02/2015   Procedure: EXTRACORPOREAL SHOCK WAVE LITHOTRIPSY (ESWL);  Surgeon: Royston Cowper, MD;  Location: [facility];  Service: Urology;  Laterality: Right;  . plates left arm for surgery 2011 Left 2011  . URETEROSCOPY WITH HOLMIUM LASER LITHOTRIPSY Right 05/07/2015   Procedure: URETEROSCOPY WITH HOLMIUM LASER LITHOTRIPSY;  Surgeon: Royston Cowper, MD;  Location: [facility];  Service: Urology;  Laterality: Right;    Prior to Admission medications   Medication Sig Start Date End Date Taking? Authorizing Provider  acetaminophen (TYLENOL) 500 MG tablet Take 500 mg by mouth every 6 (six) hours as needed.    [provider]   docusate sodium (COLACE) 100 MG capsule Take 2 capsules (200 mg total) by mouth 2 (two) times daily. 05/07/15   Royston Cowper, MD  HYDROcodone-acetaminophen (NORCO) 5-325 MG tablet Take 1 tablet by mouth every 6 (six) hours as needed for moderate pain. 03/05/16   Daymon Larsen, MD  ibuprofen (ADVIL) 600 MG tablet Take 1 tablet (600 mg total) by mouth every 6 (six) hours as needed. 01/08/19   Arta Silence, MD  Meth-Hyo-M Bl-Na Phos-Ph Sal (URIBEL) 118 MG CAPS Take 1 capsule (118 mg total) by mouth every 6 (six) hours as needed (dysuria). 05/07/15   Royston Cowper, MD  Meth-Hyo-M [place] Hatter Phos-Ph Sal (URIBEL) 118 MG CAPS Take 1 capsule (118 mg total) by mouth every 6 (six) hours as needed (dysuria). 05/21/15   Royston Cowper, MD  NUCYNTA 50 MG TABS tablet Take 1 tablet (50 mg total) by mouth every 6 (six) hours as needed for moderate pain. 1 TO 2 TABS Q 6 HOURS PRN PAIN 05/02/15   Royston Cowper, MD  ondansetron (ZOFRAN ODT) 8 MG disintegrating tablet Take 1 tablet (8 mg total) by mouth every 6 (six) hours as needed for nausea or vomiting. Patient not taking: Reported on 05/07/2015 05/02/15   Royston Cowper, MD  oxyCODONE-acetaminophen (PERCOCET) 10-325 MG tablet Take 1 tablet by mouth every 6 (six) hours as needed for up to 5 days for pain. 01/08/19 01/13/19  Arta Silence, MD  promethazine (PHENERGAN) 25 MG tablet Take 25 mg by mouth every 6 (six) hours  as needed for nausea or vomiting. Reported on 05/21/2015    [provider]  tamsulosin (FLOMAX) 0.4 MG CAPS capsule Take 1 capsule (0.4 mg total) by mouth daily after supper for 7 days. Or until you pass the stone 01/08/19 01/15/19  Arta Silence, MD    Allergies Patient has no known allergies.  No family history on file.  Social History Social History   Tobacco Use  . Smoking status: Never Smoker  . Smokeless tobacco: Never Used  Substance Use Topics  . Alcohol use: No  . Drug use: No    Review of Systems   Constitutional: No fever Eyes: No redness. ENT: No sore throat. Cardiovascular: Denies chest pain. Respiratory: Denies shortness of breath. Gastrointestinal: Positive for nausea vomiting. Genitourinary: Positive for hematuria.  Musculoskeletal: Positive for back pain. Skin: Negative for rash. Neurological: Negative for headache.   ____________________________________________   PHYSICAL EXAM:  VITAL SIGNS: ED Triage Vitals  Enc Vitals Group     BP 01/08/19 0659 (!) 146/86     Pulse Rate 01/08/19 0659 69     Resp 01/08/19 0659 (!) 22     Temp 01/08/19 0659 99 F (37.2 C)     Temp Source 01/08/19 0659 Oral     SpO2 01/08/19 0659 98 %     Weight 01/08/19 0656 180 lb (81.6 kg)     Height 01/08/19 0656 5\' 10"  (1.778 m)     Head Circumference --      Peak Flow --      Pain Score 01/08/19 0655 10     Pain Loc --      Pain Edu? --      Excl. in [place]? --     Constitutional: Alert and oriented.  Uncomfortable appearing but in no acute distress. Eyes: Conjunctivae are normal.  Head: Atraumatic. Nose: No congestion/rhinnorhea. Mouth/Throat: Mucous membranes are moist.   Neck: Normal range of motion.  Cardiovascular: Normal rate, regular rhythm. Good peripheral circulation. Respiratory: Normal respiratory effort.  No retractions.  Gastrointestinal: Soft and nontender. No distention.  Genitourinary: Mild left CVA tenderness. Musculoskeletal: Extremities warm and well perfused.  Neurologic:  Normal speech and language. No gross focal neurologic deficits are appreciated.  Skin:  Skin is warm and dry. No rash noted. Psychiatric: Mood and affect are normal. Speech and behavior are normal.  ____________________________________________   LABS (all labs ordered are listed, but only abnormal results are displayed)  Labs Reviewed  URINALYSIS, COMPLETE (UACMP) WITH MICROSCOPIC - Abnormal; Notable for the following components:      Result Value   Color, Urine YELLOW (*)     APPearance CLOUDY (*)    Hgb urine dipstick LARGE (*)    Protein, ur 30 (*)    RBC / HPF >50 (*)    All other components within normal limits  BASIC METABOLIC PANEL - Abnormal; Notable for the following components:   Potassium 3.3 (*)    Glucose, Bld 149 (*)    All other components within normal limits  CBC   ____________________________________________  EKG   ____________________________________________  RADIOLOGY    ____________________________________________   PROCEDURES  Procedure(s) performed: No  Procedures  Critical Care performed: No ____________________________________________   INITIAL IMPRESSION / ASSESSMENT AND PLAN / ED COURSE  Pertinent labs & imaging results that were available during my care of the patient were reviewed by me and considered in my medical decision making (see chart for details).  53 year old male with PMH as noted above presents with acute  onset of left flank pain several hours ago associated with nausea and vomiting.  He also has dark urine.  On exam, the patient is uncomfortable but not acutely ill-appearing.  His vital signs are normal.  The abdomen is soft and nontender.  He has mild left CVA tenderness.  Overall presentation is consistent with ureteral stone.  I reviewed the past medical records in [place], and I see that the patient had a lithotripsy in 2016.  However he states that he has also passed several stones on his own.  We will obtain basic labs, UA, give fluids and analgesia and reassess.  If the patient has refractory pain I will consider imaging although there is no indication for it at this time.  ----------------------------------------- 11:55 AM on 01/08/2019 -----------------------------------------  It has now been several hours since the patient received pain medication and he has had no recurrence of his pain.  His lab work-up is unremarkable.  He is tolerating p.o.  He feels comfortable and would like to go  home.  At this time he is stable for discharge.  I will prescribe pain medication and Flomax.  I counseled him on return precautions and he expressed understanding.  ____________________________________________   FINAL CLINICAL IMPRESSION(S) / ED DIAGNOSES  Final diagnoses:  Kidney stone      NEW MEDICATIONS STARTED DURING THIS VISIT:  New Prescriptions   IBUPROFEN (ADVIL) 600 MG TABLET    Take 1 tablet (600 mg total) by mouth every 6 (six) hours as needed.   OXYCODONE-ACETAMINOPHEN (PERCOCET) 10-325 MG TABLET    Take 1 tablet by mouth every 6 (six) hours as needed for up to 5 days for pain.   TAMSULOSIN (FLOMAX) 0.4 MG CAPS CAPSULE    Take 1 capsule (0.4 mg total) by mouth daily after supper for 7 days. Or until you pass the stone     Note:  This document was prepared using Dragon voice recognition software and may include unintentional dictation errors.    Arta Silence, MD 01/08/19 1159

## 2019-01-08 NOTE — ED Notes (Signed)
Pt verbalized understanding of discharge instructions. NAD at this time. 

## 2019-01-08 NOTE — Discharge Instructions (Signed)
Take the ibuprofen as the primary pain medication, and the Percocet if needed for more severe pain.  Take the Flomax as prescribed nightly until you pass the stone.  Return to the ER for new, worsening, or persistent severe pain, vomiting, fever, or any other new or worsening symptoms that concern you.

## 2019-04-03 ENCOUNTER — Inpatient Hospital Stay (HOSPITAL_COMMUNITY): Payer: BC Managed Care – PPO

## 2019-04-03 ENCOUNTER — Inpatient Hospital Stay (HOSPITAL_COMMUNITY)
Admission: EM | Admit: 2019-04-03 | Discharge: 2019-04-06 | DRG: 086 | Disposition: A | Discharge status: Home / Self Care | Payer: BC Managed Care – PPO | Attending: General Surgery | Admitting: General Surgery

## 2019-04-03 ENCOUNTER — Emergency Department
Admission: EM | Admit: 2019-04-03 | Discharge: 2019-04-03 | Disposition: A | Discharge status: Other Acute Inpatient Hospital | Payer: BC Managed Care – PPO | Attending: Emergency Medicine | Admitting: Emergency Medicine

## 2019-04-03 ENCOUNTER — Encounter: Payer: Self-pay | Admitting: Emergency Medicine

## 2019-04-03 ENCOUNTER — Encounter (HOSPITAL_COMMUNITY): Payer: Self-pay | Admitting: Emergency Medicine

## 2019-04-03 ENCOUNTER — Emergency Department: Payer: BC Managed Care – PPO

## 2019-04-03 ENCOUNTER — Other Ambulatory Visit: Payer: Self-pay

## 2019-04-03 DIAGNOSIS — S0219XA Other fracture of base of skull, initial encounter for closed fracture: Secondary | ICD-10-CM | POA: Insufficient documentation

## 2019-04-03 DIAGNOSIS — Z87442 Personal history of urinary calculi: Secondary | ICD-10-CM | POA: Diagnosis not present

## 2019-04-03 DIAGNOSIS — Y939 Activity, unspecified: Secondary | ICD-10-CM | POA: Insufficient documentation

## 2019-04-03 DIAGNOSIS — T148XXA Other injury of unspecified body region, initial encounter: Secondary | ICD-10-CM

## 2019-04-03 DIAGNOSIS — S2241XA Multiple fractures of ribs, right side, initial encounter for closed fracture: Secondary | ICD-10-CM | POA: Diagnosis present

## 2019-04-03 DIAGNOSIS — Y999 Unspecified external cause status: Secondary | ICD-10-CM | POA: Insufficient documentation

## 2019-04-03 DIAGNOSIS — S12601A Unspecified nondisplaced fracture of seventh cervical vertebra, initial encounter for closed fracture: Secondary | ICD-10-CM | POA: Diagnosis not present

## 2019-04-03 DIAGNOSIS — Y92410 Unspecified street and highway as the place of occurrence of the external cause: Secondary | ICD-10-CM

## 2019-04-03 DIAGNOSIS — S42001A Fracture of unspecified part of right clavicle, initial encounter for closed fracture: Secondary | ICD-10-CM | POA: Diagnosis not present

## 2019-04-03 DIAGNOSIS — Z79899 Other long term (current) drug therapy: Secondary | ICD-10-CM | POA: Insufficient documentation

## 2019-04-03 DIAGNOSIS — S42031A Displaced fracture of lateral end of right clavicle, initial encounter for closed fracture: Secondary | ICD-10-CM | POA: Diagnosis present

## 2019-04-03 DIAGNOSIS — Z20828 Contact with and (suspected) exposure to other viral communicable diseases: Secondary | ICD-10-CM | POA: Diagnosis present

## 2019-04-03 DIAGNOSIS — N189 Chronic kidney disease, unspecified: Secondary | ICD-10-CM | POA: Diagnosis not present

## 2019-04-03 DIAGNOSIS — S12600A Unspecified displaced fracture of seventh cervical vertebra, initial encounter for closed fracture: Secondary | ICD-10-CM | POA: Diagnosis present

## 2019-04-03 DIAGNOSIS — S4991XA Unspecified injury of right shoulder and upper arm, initial encounter: Secondary | ICD-10-CM | POA: Diagnosis present

## 2019-04-03 LAB — CBC WITH DIFFERENTIAL/PLATELET
Abs Immature Granulocytes: 0.13 10*3/uL — ABNORMAL HIGH (ref 0.00–0.07)
Basophils Absolute: 0.1 10*3/uL (ref 0.0–0.1)
Basophils Relative: 0 %
Eosinophils Absolute: 0 10*3/uL (ref 0.0–0.5)
Eosinophils Relative: 0 %
HCT: 39.3 % (ref 39.0–52.0)
Hemoglobin: 13.4 g/dL (ref 13.0–17.0)
Immature Granulocytes: 1 %
Lymphocytes Relative: 6 %
Lymphs Abs: 1 10*3/uL (ref 0.7–4.0)
MCH: 29.3 pg (ref 26.0–34.0)
MCHC: 34.1 g/dL (ref 30.0–36.0)
MCV: 86 fL (ref 80.0–100.0)
Monocytes Absolute: 1 10*3/uL (ref 0.1–1.0)
Monocytes Relative: 6 %
Neutro Abs: 14.5 10*3/uL — ABNORMAL HIGH (ref 1.7–7.7)
Neutrophils Relative %: 87 %
Platelets: 238 10*3/uL (ref 150–400)
RBC: 4.57 MIL/uL (ref 4.22–5.81)
RDW: 13.4 % (ref 11.5–15.5)
WBC: 16.7 10*3/uL — ABNORMAL HIGH (ref 4.0–10.5)
nRBC: 0 % (ref 0.0–0.2)

## 2019-04-03 LAB — BASIC METABOLIC PANEL
Anion gap: 10 (ref 5–15)
BUN: 18 mg/dL (ref 6–20)
CO2: 24 mmol/L (ref 22–32)
Calcium: 9.3 mg/dL (ref 8.9–10.3)
Chloride: 106 mmol/L (ref 98–111)
Creatinine, Ser: 0.98 mg/dL (ref 0.61–1.24)
GFR calc Af Amer: >60 mL/min (ref >60)
GFR calc non Af Amer: >60 mL/min (ref >60)
Glucose, Bld: 125 mg/dL — ABNORMAL HIGH (ref 70–99)
Potassium: 4 mmol/L (ref 3.5–5.1)
Sodium: 140 mmol/L (ref 135–145)

## 2019-04-03 LAB — SARS CORONAVIRUS 2 BY RT PCR (HOSPITAL ORDER, PERFORMED IN ~~LOC~~ HOSPITAL LAB): SARS Coronavirus 2: NEGATIVE

## 2019-04-03 MED ORDER — ONDANSETRON 4 MG PO TBDP
4.0000 mg | ORAL_TABLET | Freq: Four times a day (QID) | ORAL | Status: DC | PRN
  Administered 2019-04-04 – 2019-04-05 (×2): 4 mg via ORAL
  Filled 2019-04-03 (×2): qty 1

## 2019-04-03 MED ORDER — FENTANYL CITRATE (PF) 100 MCG/2ML IJ SOLN
75.0000 ug | Freq: Once | INTRAMUSCULAR | Status: AC
Start: 2019-04-03 — End: 2019-04-03
  Administered 2019-04-03: 75 ug via INTRAVENOUS
  Filled 2019-04-03: qty 2

## 2019-04-03 MED ORDER — SODIUM CHLORIDE 0.9 % IV BOLUS
1000.0000 mL | Freq: Once | INTRAVENOUS | Status: AC
  Administered 2019-04-03: 1000 mL via INTRAVENOUS

## 2019-04-03 MED ORDER — DOCUSATE SODIUM 100 MG PO CAPS
100.0000 mg | ORAL_CAPSULE | Freq: Two times a day (BID) | ORAL | Status: DC
  Administered 2019-04-03 – 2019-04-06 (×5): 100 mg via ORAL
  Filled 2019-04-03 (×5): qty 1

## 2019-04-03 MED ORDER — HYDROMORPHONE HCL 1 MG/ML IJ SOLN
1.0000 mg | Freq: Once | INTRAMUSCULAR | Status: AC
  Administered 2019-04-03: 1 mg via INTRAVENOUS
  Filled 2019-04-03: qty 1

## 2019-04-03 MED ORDER — MORPHINE SULFATE (PF) 4 MG/ML IV SOLN
4.0000 mg | INTRAVENOUS | Status: DC | PRN
  Administered 2019-04-04 (×2): 4 mg via INTRAVENOUS
  Filled 2019-04-03 (×2): qty 1

## 2019-04-03 MED ORDER — IPRATROPIUM-ALBUTEROL 0.5-2.5 (3) MG/3ML IN SOLN
3.0000 mL | RESPIRATORY_TRACT | Status: DC
  Filled 2019-04-03: qty 3

## 2019-04-03 MED ORDER — ENOXAPARIN SODIUM 30 MG/0.3ML ~~LOC~~ SOLN
30.0000 mg | Freq: Two times a day (BID) | SUBCUTANEOUS | Status: DC
  Administered 2019-04-03 – 2019-04-06 (×6): 30 mg via SUBCUTANEOUS
  Filled 2019-04-03 (×6): qty 0.3

## 2019-04-03 MED ORDER — ACETAMINOPHEN 325 MG PO TABS
650.0000 mg | ORAL_TABLET | Freq: Four times a day (QID) | ORAL | Status: DC
  Administered 2019-04-03 – 2019-04-06 (×6): 650 mg via ORAL
  Filled 2019-04-03 (×7): qty 2

## 2019-04-03 MED ORDER — TRAMADOL HCL 50 MG PO TABS
50.0000 mg | ORAL_TABLET | Freq: Four times a day (QID) | ORAL | Status: DC | PRN
  Administered 2019-04-04: 50 mg via ORAL
  Filled 2019-04-03: qty 1
  Filled 2019-04-03: qty 2

## 2019-04-03 MED ORDER — MORPHINE SULFATE (PF) 4 MG/ML IV SOLN
4.0000 mg | Freq: Once | INTRAVENOUS | Status: AC
  Administered 2019-04-03: 4 mg via INTRAVENOUS
  Filled 2019-04-03: qty 1

## 2019-04-03 MED ORDER — DEXTROSE-NACL 5-0.45 % IV SOLN
INTRAVENOUS | Status: DC
  Administered 2019-04-03 – 2019-04-04 (×3): via INTRAVENOUS

## 2019-04-03 MED ORDER — IOHEXOL 300 MG/ML  SOLN
100.0000 mL | Freq: Once | INTRAMUSCULAR | Status: AC | PRN
  Administered 2019-04-03: 100 mL via INTRAVENOUS

## 2019-04-03 MED ORDER — ONDANSETRON HCL 4 MG/2ML IJ SOLN
4.0000 mg | Freq: Four times a day (QID) | INTRAMUSCULAR | Status: DC | PRN
  Administered 2019-04-03 – 2019-04-04 (×2): 4 mg via INTRAVENOUS
  Filled 2019-04-03 (×2): qty 2

## 2019-04-03 MED ORDER — PROMETHAZINE HCL 25 MG/ML IJ SOLN
12.5000 mg | Freq: Four times a day (QID) | INTRAMUSCULAR | Status: DC | PRN
  Administered 2019-04-04 – 2019-04-05 (×3): 12.5 mg via INTRAVENOUS
  Filled 2019-04-03 (×3): qty 1
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
## 2019-04-03 NOTE — ED Notes (Signed)
Pain meds given.   Family with pt.   
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
## 2019-04-03 NOTE — ED Notes (Signed)
c-collar applied  

## 2019-04-03 NOTE — ED Notes (Signed)
Attempted report to [facility] unsuccessful x 2
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
## 2019-04-03 NOTE — ED Notes (Signed)
Consent signed by mother for transfer to [place].  Pt alert.  Iv in place.  nsr on monitor.

## 2019-04-03 NOTE — ED Notes (Signed)
Report called to brittany rn at [place]. Pt clothes given to mother, 1 belt, 1 pair of jeans, white sneakers, 2 shirts.

## 2019-04-03 NOTE — H&P (Addendum)
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 Oka, MD [facility] and [facility]
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
## 2019-04-03 NOTE — ED Notes (Signed)
[facility] in with pt now for transport to [facility].

## 2019-04-04 ENCOUNTER — Encounter (HOSPITAL_COMMUNITY): Payer: Self-pay | Admitting: Orthopedic Surgery

## 2019-04-04 DIAGNOSIS — S42001A Fracture of unspecified part of right clavicle, initial encounter for closed fracture: Secondary | ICD-10-CM

## 2019-04-04 HISTORY — DX: Fracture of unspecified part of right clavicle, initial encounter for closed fracture: S42.001A

## 2019-04-04 LAB — CBC
HCT: 39.8 % (ref 39.0–52.0)
Hemoglobin: 13.5 g/dL (ref 13.0–17.0)
MCH: 29.5 pg (ref 26.0–34.0)
MCHC: 33.9 g/dL (ref 30.0–36.0)
MCV: 87.1 fL (ref 80.0–100.0)
Platelets: 244 10*3/uL (ref 150–400)
RBC: 4.57 MIL/uL (ref 4.22–5.81)
RDW: 13.7 % (ref 11.5–15.5)
WBC: 7.9 10*3/uL (ref 4.0–10.5)
nRBC: 0 % (ref 0.0–0.2)

## 2019-04-04 LAB — BASIC METABOLIC PANEL
Anion gap: 13 (ref 5–15)
BUN: 11 mg/dL (ref 6–20)
CO2: 23 mmol/L (ref 22–32)
Calcium: 8.7 mg/dL — ABNORMAL LOW (ref 8.9–10.3)
Chloride: 102 mmol/L (ref 98–111)
Creatinine, Ser: 0.97 mg/dL (ref 0.61–1.24)
GFR calc Af Amer: >60 mL/min (ref >60)
GFR calc non Af Amer: >60 mL/min (ref >60)
Glucose, Bld: 116 mg/dL — ABNORMAL HIGH (ref 70–99)
Potassium: 3.9 mmol/L (ref 3.5–5.1)
Sodium: 138 mmol/L (ref 135–145)

## 2019-04-04 LAB — HIV ANTIBODY (ROUTINE TESTING W REFLEX): HIV Screen 4th Generation wRfx: NONREACTIVE

## 2019-04-04 LAB — MRSA PCR SCREENING: MRSA by PCR: NEGATIVE

## 2019-04-04 MED ORDER — METHOCARBAMOL 750 MG PO TABS
750.0000 mg | ORAL_TABLET | Freq: Three times a day (TID) | ORAL | Status: DC | PRN
  Administered 2019-04-04 – 2019-04-05 (×2): 750 mg via ORAL
  Filled 2019-04-04 (×2): qty 1

## 2019-04-04 MED ORDER — IPRATROPIUM-ALBUTEROL 0.5-2.5 (3) MG/3ML IN SOLN
3.0000 mL | Freq: Two times a day (BID) | RESPIRATORY_TRACT | Status: DC
  Filled 2019-04-04: qty 3
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
## 2019-04-04 NOTE — Plan of Care (Signed)

## 2019-04-04 NOTE — Progress Notes (Signed)
Orthopedic Tech Progress Note Patient Details:  Michael York 16-Nov-1965 [id number]  Ortho Devices Type of Ortho Device: Sling immobilizer Ortho Device/Splint Location: RUE Ortho Device/Splint Interventions: Adjustment, Application, Ordered   Post Interventions Patient Tolerated: Well Instructions Provided: Care of device, Adjustment of device   Janit Pagan 04/04/2019, 1:21 PM
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
## 2019-04-05 ENCOUNTER — Inpatient Hospital Stay (HOSPITAL_COMMUNITY): Payer: BC Managed Care – PPO

## 2019-04-05 DIAGNOSIS — S0219XA Other fracture of base of skull, initial encounter for closed fracture: Secondary | ICD-10-CM

## 2019-04-05 DIAGNOSIS — T148XXA Other injury of unspecified body region, initial encounter: Secondary | ICD-10-CM

## 2019-04-05 DIAGNOSIS — N189 Chronic kidney disease, unspecified: Secondary | ICD-10-CM

## 2019-04-05 DIAGNOSIS — S2241XA Multiple fractures of ribs, right side, initial encounter for closed fracture: Secondary | ICD-10-CM

## 2019-04-05 DIAGNOSIS — S42031A Displaced fracture of lateral end of right clavicle, initial encounter for closed fracture: Secondary | ICD-10-CM

## 2019-04-05 MED ORDER — ONDANSETRON HCL 4 MG/2ML IJ SOLN
4.0000 mg | INTRAMUSCULAR | Status: DC | PRN
  Administered 2019-04-05: 4 mg via INTRAVENOUS
  Filled 2019-04-05: qty 2

## 2019-04-05 MED ORDER — ONDANSETRON 4 MG PO TBDP: 4.0000 mg | ORAL_TABLET | ORAL | Status: DC | PRN

## 2019-04-05 MED ORDER — OXYCODONE HCL 5 MG PO TABS
5.0000 mg | ORAL_TABLET | ORAL | Status: DC | PRN
  Administered 2019-04-05: 10 mg via ORAL
  Filled 2019-04-05: qty 2
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
## 2019-04-05 NOTE — Consult Note (Signed)
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 Degenerative changes greatest in the cervical spine at C5-6 and C6-7. Facet degenerative changes greatest on the left at C3-4 and C4-5. Upper chest: Comminuted right clavicular fracture, mid shaft partially imaged. Other: None IMPRESSION: 1. No acute intracranial abnormality. 2. Right temporal bone fracture extending longitudinally through the middle ear. Small amount of fluid in the middle ear. Please see dedicated temporal bone study for further detail. 3. Fracture through the lateral mass of C7 on the left. No subluxation or dislocation. 4. No additional fracture or traumatic malalignment. Degenerative changes. 5. Comminuted right clavicular fracture, mid shaft partially imaged. Electronically Signed: By: Zetta Bills M.D. On: 04/03/2019 19:16   Ct Temporal Bones Wo Contrast  Result Date: 04/03/2019 CLINICAL DATA:  Head trauma. Right hemotympanum. EXAM: CT TEMPORAL BONES WITHOUT CONTRAST TECHNIQUE: Axial and coronal plane CT imaging of the petrous temporal bones was performed with thin-collimation image reconstruction. No intravenous contrast was administered.  Multiplanar CT image reconstructions were also generated. COMPARISON:  CT head and face of the same day. FINDINGS: Longitudinal right temporal bone fracture extends through the right external auditory canal and temporomandibular fossa. The fracture line extends through the middle ear cavity. The middle ear ossicles are intact. There is fluid within the right middle ear cavity. Mastoid air cells are clear. Inner ear structures are within normal limits. Fracture is lateral to the right carotid canal. Sphenoid bone is intact. Mucosal thickening present in the right sphenoid sinus without associated fracture. The left external auditory canal is within normal limits. The tympanic membrane is visualized and appears to be intact. The middle ear ossicles are normally formed and articulating. Middle ear cavity is clear. Epitympanum unremarkable. The mastoid air cells are clear. Oval window is patent. The inner ear structures are normally formed. The superior semicircular canal is covered. The internal auditory canal and vestibular aqueduct are within normal limits. IMPRESSION: 1. Longitudinal right temporal bone fracture extending through the right external auditory canal and temporomandibular fossa. 2. The fracture line extends through the middle ear cavity. 3. The middle ear ossicles are intact. 4. Fluid within the right middle ear cavity, likely hemorrhage. 5. Normal appearance of the left temporal bone. Electronically Signed   By: San Morelle M.D.   On: 04/03/2019 18:57    Review of Systems  Constitutional: Negative.   HENT: Positive for ear pain. Negative for sore throat.   Eyes: Negative for blurred vision and double vision.  Respiratory: Negative for shortness of breath.   Cardiovascular: Positive for chest pain.       Right clavicle pain  Gastrointestinal: Negative.   Genitourinary: Negative for dysuria.  Skin: Negative.   Psychiatric/Behavioral: Negative.    Blood pressure (!) 142/97, pulse  82, temperature 98.8 F (37.1 C), temperature  source Oral, resp. rate 18, height 5\' 10"  (1.778 m), weight 79.4 kg, SpO2 98 %. Physical Exam  Constitutional: He is oriented to person, place, and time. He appears well-developed and well-nourished.  HENT:  Head: Normocephalic.  Right Ear: There is drainage, swelling and tenderness.  Eyes: EOM are normal.  Neck: No thyromegaly present.  Respiratory: Effort normal. He exhibits tenderness.  GI: Soft. He exhibits no distension.  Neurological: He is alert and oriented to person, place, and time.  Skin: Skin is warm.  Psychiatric: He has a normal mood and affect. His behavior is normal. Thought content normal.    Assessment/Plan:   ICD-10-CM   1. Closed displaced fracture of acromial end of right clavicle, initial encounter  S42.031A Ambulatory referral to Occupational Therapy  2. Closed fracture of multiple ribs of right side, initial encounter  S22.41XA   3. Closed fracture of temporal bone, initial encounter ([place])  S02.19XA   4. Fracture  T14.8XXA DG Clavicle Right    DG Clavicle Right    DG Clavicle Right    DG Clavicle Right    Recommend soft diet.  Very good oral hygiene.  Avoid nose blowing.  Head of bed elevated as able.  Consider ENT consult for inner ear. Follow up in 2 weeks. No surgical intervention planned.   Loel Lofty Vedha Tercero 04/05/2019, 11:58 AM

## 2019-04-05 NOTE — Discharge Instructions (Addendum)
How to Use a Hard Cervical Collar A hard cervical collar limits the movement of the top part of your spine (cervical spine). The collar holds your head and neck in a straight position. A cervical collar may be used to treat:  A fracture in the neck.  Damage to the ligaments. Ligaments are tissues that connect bones.  Injury to the spinal cord. There are several types of hard cervical collars. Follow the manufacturer's instructions for use. These are general guidelines. What are the risks? Wearing a cervical collar is safe. However, problems may occur, including:  Skin breakdown.  Sores that form due to rubbing or pressure on the skin (pressure ulcers).  Pain.  Difficulty breathing.  Worsening of your condition if the collar is not placed correctly.  Increased risk of inhaling food or liquid into your lungs (aspiration). Supplies needed:  Extra cervical collar and replacement pads.  Ice.  Plastic bag.  Towel.  A watertight covering to put over the collar during bathing, if needed. How to use a cervical collar  Wear the cervical collar as told by your health care provider. Do not remove it unless told to do so by your health care provider.  You may be directed to remove it only when you check your skin and change the pads. While the collar is off: ? Ask another person to assist you if needed. ? Keep your head and neck straight.  Do not bend your neck or turn your head. ? Check your skin daily for red areas. Ask for help or use a mirror to check areas you cannot see. ? After checking your skin, wear the extra cervical collar while cleaning and changing the pads of the other collar.  Change the pads daily or more often if they become wet or dirty. Keep a clean set of replacement pads.  Do not let hard plastic edges touch your skin. Cover them with a soft pad.  If your cervical collar is not waterproof: ? Do not let it get wet. ? Cover it with a watertight covering when  you take a bath or a shower. Follow these instructions at home:   Put ice on the injured area to manage pain, stiffness, and swelling. ? Do not remove your cervical collar unless told to do so by your health care provider. ? Put ice in a plastic bag. ? Place a towel between your skin and the bag or between your cervical collar and the bag. ? Leave the ice on for 20 minutes, 2-3 times a day for the first 2 days after your injury.  Do not drive any vehicle until your health care provider approves.  Keep all follow-up visits as told by your health care provider. This is important. Any delay in getting the care that you need can prevent proper healing of your injury. Contact a health care provider if you have:  Red areas of skin under your cervical collar.  Pain that is not controlled with your medicines. Get help right away if you have:  Numbness or weakness in your arms or legs.  Difficulty breathing. These symptoms may represent a serious problem that is an emergency. Do not wait to see if the symptoms will go away. Get medical help right away. Call your local emergency services (911 in the [place] Do not drive yourself to the hospital. Summary  A cervical collar is a device that supports the chin and the back of the head. It restricts movement of the neck to  prevent more damage after a severe injury.  A cervical collar may be used to treat a fracture in the neck, damage to tissues that hold bones together (ligaments), or injury to the spinal cord.  Wear the cervical collar as told by your health care provider. Ask if you may remove the collar to shower, bathe, or eat, or to put ice on your neck. This information is not intended to replace advice given to you by your health care provider. Make sure you discuss any questions you have with your health care provider. Document Released: 02/08/2004 Document Revised: 11/23/2017 Document Reviewed: 04/09/2017 Elsevier Patient Education  2020  [employer].    Clavicle Fracture  A clavicle fracture is a broken collarbone. The collarbone is the long bone that connects your shoulder to your chest wall. A broken collarbone may be treated with a sling or with surgery. Treatment depends on whether the broken ends of the bone are out of place or not. Follow these instructions at home: If you have a sling:  Wear the sling as told by your doctor. Take it off only as told by your doctor.  Loosen the sling if your fingers tingle, become numb, or turn cold and blue.  Do not lift your arm. Keep it across your chest.  Keep the sling clean.  Ask your doctor if you may take off the sling for bathing. ? If your sling is not waterproof, do not let it get wet. Cover the sling with a watertight covering if you take a bath or a shower while wearing it. ? If you may take off your sling when you take a bath or a shower, keep your shoulder in the same position as when the sling is on. Managing pain, stiffness, and swelling   If told, put ice on the injured area: ? If you have a removable sling, take it off as told by your doctor. ? Put ice in a plastic bag. ? Place a towel between your skin and the bag. ? Leave the ice on for 20 minutes, 2-3 times a day. Activity  Avoid activities that make your symptoms worse for 4-6 weeks, or as long as told.  Ask your doctor when it is safe for you to drive.  Do exercises as told by your doctor. General instructions  Do not use any products that contain nicotine or tobacco, such as cigarettes and e-cigarettes. These can delay bone healing. If you need help quitting, ask your doctor.  Take over-the-counter and prescription medicines only as told by your doctor.  Keep all follow-up visits as told by your doctor. This is important. Contact a doctor if:  Your medicine is not making you feel less pain.  Your medicine is not making swelling better. Get help right away if:  Your cannot feel your arm  (your arm is numb).  Your arm is cold.  Your arm is a lighter color than normal. Summary  A clavicle fracture is a broken collarbone. The collarbone is the long bone that connects your shoulder to your chest wall.  Treatment depends on whether the broken ends of the bone are out of place or not.  If you have a sling, wear it as told by your doctor.  Do exercises when your doctor says you can. The exercises will help your arm get strong and move like it used to. This information is not intended to replace advice given to you by your health care provider. Make sure you discuss any  questions you have with your health care provider. Document Released: 11/04/2007 Document Revised: 04/30/2017 Document Reviewed: 04/06/2016 Elsevier Patient Education  2020 [employer]   1. PAIN CONTROL:  1. Pain is best controlled by a usual combination of three different methods TOGETHER:  i. Ice/Heat ii. Over the counter pain medication iii. Prescription pain medication 2. You may experience some swelling and bruising in area of broken ribs. Ice packs or heating pads (30-60 minutes up to 6 times a day) will help. Use ice for the first few days to help decrease swelling and bruising, then switch to heat to help relax tight/sore spots and speed recovery. Some people prefer to use ice alone, heat alone, alternating between ice & heat. Experiment to what works for you. Swelling and bruising can take several weeks to resolve.  3. It is helpful to take an over-the-counter pain medication regularly for the first few weeks. Choose one of the following that works best for you:  i. Naproxen (Aleve, etc) Two 220mg  tabs twice a day ii. Ibuprofen (Advil, etc) Three 200mg  tabs four times a day (every meal & bedtime) iii. Acetaminophen (Tylenol, etc) 500-650mg  four times a day (every meal & bedtime) 4. A prescription for pain medication (such as oxycodone, hydrocodone, etc) may be  given to you upon discharge. Take your pain medication as prescribed.  i. If you are having problems/concerns with the prescription medicine (does not control pain, nausea, vomiting, rash, itching, etc), please call us [phone] to see if we need to switch you to a different pain medicine that will work better for you and/or control your side effect better. ii. If you need a refill on your pain medication, please contact your pharmacy. They will contact our office to request authorization. Prescriptions will not be filled after 5 pm or on week-ends. 1. Avoid getting constipated. When taking pain medications, it is common to experience some constipation. Increasing fluid intake and taking a fiber supplement (such as Metamucil, Citrucel, FiberCon, MiraLax, etc) 1-2 times a day regularly will usually help prevent this problem from occurring. A mild laxative (prune juice, Milk of Magnesia, MiraLax, etc) should be taken according to package directions if there are no bowel movements after 48 hours.  2. Watch out for diarrhea. If you have many loose bowel movements, simplify your diet to bland foods & liquids for a few days. Stop any stool softeners and decrease your fiber supplement. Switching to mild anti-diarrheal medications (Kayopectate, Pepto Bismol) can help. If this worsens or does not improve, please call us. 3. FOLLOW UP  a. If a follow up appointment is needed one will be scheduled for you. If none is needed with our trauma team, please follow up with your primary care provider within 2-3 weeks from discharge. Please call [facility] at [phone] if you have any questions about follow up.  b. If you have any orthopedic or other injuries you will need to follow up as outlined in your follow up instructions.   WHEN TO CALL us [phone]:  1. Poor pain control 2. Reactions / problems with new medications (rash/itching, nausea, etc)  3. Fever over 101.5 F (38.5 C) 4. Worsening swelling or  bruising 5. Worsening pain, productive cough, difficulty breathing or any other concerning symptoms  The clinic staff is available to answer your questions during regular business hours (8:30am-5pm). Please dont hesitate to call and ask to speak to one of our nurses for clinical concerns.  If you have a medical emergency, go to the nearest emergency room or call 911.  A surgeon from [facility] is always on call at the [facility], [place], [place], [place], [place] [address] ?  MAIN: [phone] ? TOLL FREE: [phone] ?  FAX [phone]  [email or web address]      Information on Rib Fractures  A rib fracture is a break or crack in one of the bones of the ribs. The ribs are long, curved bones that wrap around your chest and attach to your spine and your breastbone. The ribs protect your heart, lungs, and other organs in the chest. A broken or cracked rib is often painful but is not usually serious. Most rib fractures heal on their own over time. However, rib fractures can be more serious if multiple ribs are broken or if broken ribs move out of place and push against other structures or organs. What are the causes? This condition is caused by:  Repetitive movements with high force, such as pitching a baseball or having severe coughing spells.  A direct blow to the chest, such as a sports injury, a car accident, or a fall.  Cancer that has spread to the bones, which can weaken bones and cause them to break. What are the signs or symptoms? Symptoms of this condition include:  Pain when you breathe in or cough.  Pain when someone presses on the injured area.  Feeling short of breath. How is this diagnosed? This condition is diagnosed with a physical exam and medical history. Imaging tests may also be done, such as:  Chest X-ray.  CT scan.  MRI.  Bone scan.  Chest ultrasound. How is this  treated? Treatment for this condition depends on the severity of the fracture. Most rib fractures usually heal on their own in 1-3 months. Sometimes healing takes longer if there is a cough that does not stop or if there are other activities that make the injury worse (aggravating factors). While you heal, you will be given medicines to control the pain. You will also be taught deep breathing exercises. Severe injuries may require hospitalization or surgery. Follow these instructions at home: Managing pain, stiffness, and swelling  If directed, apply ice to the injured area. ? Put ice in a plastic bag. ? Place a towel between your skin and the bag. ? Leave the ice on for 20 minutes, 2-3 times a day.  Take over-the-counter and prescription medicines only as told by your health care provider. Activity  Avoid a lot of activity and any activities or movements that cause pain. Be careful during activities and avoid bumping the injured rib.  Slowly increase your activity as told by your health care provider. General instructions  Do deep breathing exercises as told by your health care provider. This helps prevent pneumonia, which is a common complication of a broken rib. Your health care provider may instruct you to: ? Take deep breaths several times a day. ? Try to cough several times a day, holding a pillow against the injured area. ? Use a device called incentive spirometer to practice deep breathing several times a day.  Drink enough fluid to keep your urine pale yellow.  Do not wear a rib belt or binder. These restrict breathing, which can lead to pneumonia.  Keep all follow-up visits as told by your health care provider. This is important. Contact a health care provider if:  You have  a fever. Get help right away if:  You have difficulty breathing or you are short of breath.  You develop a cough that does not stop, or you cough up thick or bloody sputum.  You have nausea,  vomiting, or pain in your abdomen.  Your pain gets worse and medicine does not help. Summary  A rib fracture is a break or crack in one of the bones of the ribs.  A broken or cracked rib is often painful but is not usually serious.  Most rib fractures heal on their own over time.  Treatment for this condition depends on the severity of the fracture.  Avoid a lot of activity and any activities or movements that cause pain. This information is not intended to replace advice given to you by your health care provider. Make sure you discuss any questions you have with your health care provider. Document Released: 05/18/2005 Document Revised: 08/17/2016 Document Reviewed: 08/17/2016 Elsevier Interactive Patient Education  2019 [employer].
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
## 2019-04-06 SURGERY — OPEN REDUCTION INTERNAL FIXATION (ORIF) CLAVICULAR FRACTURE
Anesthesia: General | Laterality: Right

## 2019-04-06 MED ORDER — IPRATROPIUM-ALBUTEROL 0.5-2.5 (3) MG/3ML IN SOLN: 3.0000 mL | RESPIRATORY_TRACT | Status: DC | PRN

## 2019-04-06 MED ORDER — ACETAMINOPHEN 325 MG PO TABS: 650.0000 mg | ORAL_TABLET | Freq: Four times a day (QID) | ORAL | Status: AC

## 2019-04-06 MED ORDER — METHOCARBAMOL 750 MG PO TABS: 750.0000 mg | ORAL_TABLET | Freq: Three times a day (TID) | ORAL | 0 refills | Status: DC | PRN

## 2019-04-06 MED ORDER — ONDANSETRON 4 MG PO TBDP: 4.0000 mg | ORAL_TABLET | ORAL | 0 refills | Status: DC | PRN

## 2019-04-06 MED ORDER — OXYCODONE HCL 5 MG PO TABS: 5.0000 mg | ORAL_TABLET | ORAL | 0 refills | Status: DC | PRN

## 2019-04-06 MED FILL — METHOCARBAMOL 750 MG TABS: 750 | 15 days supply | Qty: 45 | Fill #0

## 2019-04-06 MED FILL — ONDANSETRON ODT 4 MG TABLET: 4 | 5 days supply | Qty: 30 | Fill #0

## 2019-04-06 MED FILL — oxyCODONE HCL 5 MG TABS: 5 | 5 days supply | Qty: 30 | Fill #0
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
## 2019-04-06 NOTE — Progress Notes (Signed)
Discharge instructions given to patient mother at bedside. Med brought to patient from transition pharmacy and given directly to him. PIV removed and patient accompanied out via [place]. Katherina Right RN
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
## 2019-04-12 ENCOUNTER — Other Ambulatory Visit: Payer: Self-pay

## 2019-04-12 ENCOUNTER — Emergency Department
Admission: EM | Admit: 2019-04-12 | Discharge: 2019-04-12 | Disposition: A | Discharge status: Home / Self Care | Payer: BC Managed Care – PPO | Attending: Emergency Medicine | Admitting: Emergency Medicine

## 2019-04-12 DIAGNOSIS — R109 Unspecified abdominal pain: Secondary | ICD-10-CM | POA: Diagnosis not present

## 2019-04-12 DIAGNOSIS — N189 Chronic kidney disease, unspecified: Secondary | ICD-10-CM | POA: Insufficient documentation

## 2019-04-12 LAB — COMPREHENSIVE METABOLIC PANEL
ALT: 24 U/L (ref 0–44)
AST: 24 U/L (ref 15–41)
Albumin: 4.2 g/dL (ref 3.5–5.0)
Alkaline Phosphatase: 121 U/L (ref 38–126)
Anion gap: 12 (ref 5–15)
BUN: 20 mg/dL (ref 6–20)
CO2: 25 mmol/L (ref 22–32)
Calcium: 10 mg/dL (ref 8.9–10.3)
Chloride: 100 mmol/L (ref 98–111)
Creatinine, Ser: 1.04 mg/dL (ref 0.61–1.24)
GFR calc Af Amer: >60 mL/min (ref >60)
GFR calc non Af Amer: >60 mL/min (ref >60)
Glucose, Bld: 133 mg/dL — ABNORMAL HIGH (ref 70–99)
Potassium: 3.7 mmol/L (ref 3.5–5.1)
Sodium: 137 mmol/L (ref 135–145)
Total Bilirubin: 0.5 mg/dL (ref 0.3–1.2)
Total Protein: 7.6 g/dL (ref 6.5–8.1)

## 2019-04-12 LAB — CBC
HCT: 43.2 % (ref 39.0–52.0)
Hemoglobin: 14.5 g/dL (ref 13.0–17.0)
MCH: 28.7 pg (ref 26.0–34.0)
MCHC: 33.6 g/dL (ref 30.0–36.0)
MCV: 85.5 fL (ref 80.0–100.0)
Platelets: 361 10*3/uL (ref 150–400)
RBC: 5.05 MIL/uL (ref 4.22–5.81)
RDW: 13.5 % (ref 11.5–15.5)
WBC: 7.9 10*3/uL (ref 4.0–10.5)
nRBC: 0 % (ref 0.0–0.2)

## 2019-04-12 MED ORDER — KETOROLAC TROMETHAMINE 30 MG/ML IJ SOLN
30.0000 mg | Freq: Once | INTRAMUSCULAR | Status: AC
  Administered 2019-04-12: 30 mg via INTRAVENOUS
  Filled 2019-04-12: qty 1

## 2019-04-12 MED ORDER — SODIUM CHLORIDE 0.9 % IV BOLUS
1000.0000 mL | Freq: Once | INTRAVENOUS | Status: AC
  Administered 2019-04-12: 21:00:00 1000 mL via INTRAVENOUS

## 2019-04-12 MED ORDER — ONDANSETRON 4 MG PO TBDP
4.0000 mg | ORAL_TABLET | Freq: Once | ORAL | Status: AC | PRN
  Administered 2019-04-12: 4 mg via ORAL
  Filled 2019-04-12: qty 1

## 2019-04-12 MED ORDER — MORPHINE SULFATE (PF) 4 MG/ML IV SOLN
4.0000 mg | Freq: Once | INTRAVENOUS | Status: AC
  Administered 2019-04-12: 4 mg via INTRAVENOUS
  Filled 2019-04-12: qty 1
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
## 2019-04-12 NOTE — Discharge Instructions (Addendum)
Call Dr. Yves Dill tomorrow as discussed.  Take the oxycodone that  you already have as needed for pain.  Return to the ER for new, worsening or persistent severe pain, vomiting, fever, weakness or any other new or worsening symptoms that concern you.
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
## 2019-05-04 ENCOUNTER — Other Ambulatory Visit: Payer: Self-pay | Admitting: Neurosurgery

## 2019-05-04 DIAGNOSIS — S12691D Other nondisplaced fracture of seventh cervical vertebra, subsequent encounter for fracture with routine healing: Secondary | ICD-10-CM

## 2019-05-15 ENCOUNTER — Other Ambulatory Visit: Payer: Self-pay

## 2019-05-15 ENCOUNTER — Ambulatory Visit
Admission: RE | Admit: 2019-05-15 | Discharge: 2019-05-15 | Disposition: A | Discharge status: Home / Self Care | Payer: BC Managed Care – PPO | Source: Ambulatory Visit | Attending: Neurosurgery | Admitting: Neurosurgery

## 2019-05-15 DIAGNOSIS — S12691D Other nondisplaced fracture of seventh cervical vertebra, subsequent encounter for fracture with routine healing: Secondary | ICD-10-CM | POA: Diagnosis not present

## 2019-06-26 ENCOUNTER — Other Ambulatory Visit: Payer: Self-pay | Admitting: Neurosurgery

## 2019-06-26 DIAGNOSIS — S12601D Unspecified nondisplaced fracture of seventh cervical vertebra, subsequent encounter for fracture with routine healing: Secondary | ICD-10-CM

## 2019-07-04 ENCOUNTER — Ambulatory Visit
Admission: RE | Admit: 2019-07-04 | Discharge: 2019-07-04 | Disposition: A | Discharge status: Home / Self Care | Payer: BC Managed Care – PPO | Source: Ambulatory Visit | Attending: Neurosurgery | Admitting: Neurosurgery

## 2019-07-04 ENCOUNTER — Other Ambulatory Visit: Payer: Self-pay

## 2019-07-04 DIAGNOSIS — S12601D Unspecified nondisplaced fracture of seventh cervical vertebra, subsequent encounter for fracture with routine healing: Secondary | ICD-10-CM

## 2019-08-03 ENCOUNTER — Encounter: Payer: Self-pay | Admitting: Urology

## 2019-08-03 ENCOUNTER — Ambulatory Visit
Admission: RE | Admit: 2019-08-03 | Discharge: 2019-08-03 | Disposition: A | Discharge status: Home / Self Care | Payer: BC Managed Care – PPO | Attending: Urology | Admitting: Urology

## 2019-08-03 ENCOUNTER — Encounter
Admission: RE | Disposition: A | Discharge status: Home / Self Care | Payer: Self-pay | Source: Home / Self Care | Attending: Urology

## 2019-08-03 DIAGNOSIS — N201 Calculus of ureter: Secondary | ICD-10-CM | POA: Diagnosis not present

## 2019-08-03 HISTORY — PX: EXTRACORPOREAL SHOCK WAVE LITHOTRIPSY: SHX1557

## 2019-08-03 SURGERY — LITHOTRIPSY, ESWL
Anesthesia: Moderate Sedation | Laterality: Left

## 2019-08-03 MED ORDER — MIDAZOLAM HCL 2 MG/2ML IJ SOLN
INTRAMUSCULAR | Status: AC
  Administered 2019-08-03: 1 mg via INTRAMUSCULAR
  Filled 2019-08-03: qty 2

## 2019-08-03 MED ORDER — LEVOFLOXACIN 500 MG PO TABS
ORAL_TABLET | ORAL | Status: AC
  Administered 2019-08-03: 500 mg via ORAL
  Filled 2019-08-03: qty 1

## 2019-08-03 MED ORDER — MORPHINE SULFATE (PF) 10 MG/ML IV SOLN: 10.0000 mg | Freq: Once | INTRAVENOUS | Status: AC

## 2019-08-03 MED ORDER — LEVOFLOXACIN 500 MG PO TABS: 500.0000 mg | ORAL_TABLET | Freq: Once | ORAL | Status: AC

## 2019-08-03 MED ORDER — DIPHENHYDRAMINE HCL 25 MG PO CAPS
ORAL_CAPSULE | ORAL | Status: AC
  Administered 2019-08-03: 25 mg via ORAL
  Filled 2019-08-03: qty 1

## 2019-08-03 MED ORDER — TAMSULOSIN HCL 0.4 MG PO CAPS: 0.4000 mg | ORAL_CAPSULE | Freq: Every day | ORAL | 1 refills | Status: DC

## 2019-08-03 MED ORDER — FUROSEMIDE 10 MG/ML IJ SOLN
INTRAMUSCULAR | Status: AC
  Administered 2019-08-03: 10 mg via INTRAVENOUS
  Filled 2019-08-03: qty 2

## 2019-08-03 MED ORDER — DEXTROSE-NACL 5-0.45 % IV SOLN: INTRAVENOUS | Status: DC

## 2019-08-03 MED ORDER — DIPHENHYDRAMINE HCL 25 MG PO CAPS: 25.0000 mg | ORAL_CAPSULE | Freq: Once | ORAL | Status: AC

## 2019-08-03 MED ORDER — MORPHINE SULFATE (PF) 10 MG/ML IV SOLN
INTRAVENOUS | Status: AC
  Administered 2019-08-03: 10:00:00 10 mg via INTRAMUSCULAR
  Filled 2019-08-03: qty 1

## 2019-08-03 MED ORDER — CIPROFLOXACIN HCL 500 MG PO TABS: 500.0000 mg | ORAL_TABLET | Freq: Two times a day (BID) | ORAL | 0 refills | Status: DC

## 2019-08-03 MED ORDER — FUROSEMIDE 10 MG/ML IJ SOLN: 10.0000 mg | Freq: Once | INTRAMUSCULAR | Status: AC

## 2019-08-03 MED ORDER — MIDAZOLAM HCL 2 MG/2ML IJ SOLN: 1.0000 mg | Freq: Once | INTRAMUSCULAR | Status: AC

## 2019-08-03 MED ORDER — PROMETHAZINE HCL 25 MG/ML IJ SOLN: 25.0000 mg | Freq: Once | INTRAMUSCULAR | Status: AC

## 2019-08-03 MED ORDER — PROMETHAZINE HCL 25 MG/ML IJ SOLN
INTRAMUSCULAR | Status: AC
  Administered 2019-08-03: 25 mg via INTRAMUSCULAR
  Filled 2019-08-03: qty 1

## 2019-08-03 NOTE — Discharge Instructions (Signed)
Lithotripsy, Care After This sheet gives you information about how to care for yourself after your procedure. Your health care provider may also give you more specific instructions. If you have problems or questions, contact your health care provider. What can I expect after the procedure? After the procedure, it is common to have:  Some blood in your urine. This should only last for a few days.  Soreness in your back, sides, or upper abdomen for a few days.  Blotches or bruises on your back where the pressure wave entered the skin.  Pain, discomfort, or nausea when pieces (fragments) of the kidney stone move through the tube that carries urine from the kidney to the bladder (ureter). Stone fragments may pass soon after the procedure, but they may continue to pass for up to 4-8 weeks. ? If you have severe pain or nausea, contact your health care provider. This may be caused by a large stone that was not broken up, and this may mean that you need more treatment.  Some pain or discomfort during urination.  Some pain or discomfort in the lower abdomen or (in men) at the base of the penis. Follow these instructions at home: Medicines  Take over-the-counter and prescription medicines only as told by your health care provider.  If you were prescribed an antibiotic medicine, take it as told by your health care provider. Do not stop taking the antibiotic even if you start to feel better.  Do not drive for 24 hours if you were given a medicine to help you relax (sedative).  Do not drive or use heavy machinery while taking prescription pain medicine. Eating and drinking      Drink enough water and fluids to keep your urine clear or pale yellow. This helps any remaining pieces of the stone to pass. It can also help prevent new stones from forming.  Eat plenty of fresh fruits and vegetables.  Follow instructions from your health care provider about eating and drinking restrictions. You may be  instructed: ? To reduce how much salt (sodium) you eat or drink. Check ingredients and nutrition facts on packaged foods and beverages. ? To reduce how much meat you eat.  Eat the recommended amount of calcium for your age and gender. Ask your health care provider how much calcium you should have. General instructions  Get plenty of rest.  Most people can resume normal activities 1-2 days after the procedure. Ask your health care provider what activities are safe for you.  Your health care provider may direct you to lie in a certain position (postural drainage) and tap firmly (percuss) over your kidney area to help stone fragments pass. Follow instructions as told by your health care provider.  If directed, strain all urine through the strainer that was provided by your health care provider. ? Keep all fragments for your health care provider to see. Any stones that are found may be sent to a medical lab for examination. The stone may be as small as a grain of salt.  Keep all follow-up visits as told by your health care provider. This is important. Contact a health care provider if:  You have pain that is severe or does not get better with medicine.  You have nausea that is severe or does not go away.  You have blood in your urine longer than your health care provider told you to expect.  You have more blood in your urine.  You have pain during urination that does   not go away.  You urinate more frequently than usual and this does not go away.  You develop a rash or any other possible signs of an allergic reaction. Get help right away if:  You have severe pain in your back, sides, or upper abdomen.  You have severe pain while urinating.  Your urine is very dark red.  You have blood in your stool (feces).  You cannot pass any urine at all.  You feel a strong urge to urinate after emptying your bladder.  You have a fever or chills.  You develop shortness of breath,  difficulty breathing, or chest pain.  You have severe nausea that leads to persistent vomiting.  You faint. Summary  After this procedure, it is common to have some pain, discomfort, or nausea when pieces (fragments) of the kidney stone move through the tube that carries urine from the kidney to the bladder (ureter). If this pain or nausea is severe, however, you should contact your health care provider.  Most people can resume normal activities 1-2 days after the procedure. Ask your health care provider what activities are safe for you.  Drink enough water and fluids to keep your urine clear or pale yellow. This helps any remaining pieces of the stone to pass, and it can help prevent new stones from forming.  If directed, strain your urine and keep all fragments for your health care provider to see. Fragments or stones may be as small as a grain of salt.  Get help right away if you have severe pain in your back, sides, or upper abdomen or have severe pain while urinating. This information is not intended to replace advice given to you by your health care provider. Make sure you discuss any questions you have with your health care provider. Document Revised: 08/29/2018 Document Reviewed: 04/08/2016 Elsevier Patient Education  2020 [employer]  

## 2019-09-14 ENCOUNTER — Encounter: Admission: RE | Payer: Self-pay | Source: Home / Self Care

## 2019-09-14 ENCOUNTER — Ambulatory Visit: Admission: RE | Admit: 2019-09-14 | Payer: BC Managed Care – PPO | Source: Home / Self Care | Admitting: Urology

## 2019-09-14 SURGERY — LITHOTRIPSY, ESWL
Anesthesia: Moderate Sedation | Laterality: Right

## 2019-09-21 ENCOUNTER — Ambulatory Visit
Admission: RE | Admit: 2019-09-21 | Discharge: 2019-09-21 | Disposition: A | Discharge status: Home / Self Care | Payer: BC Managed Care – PPO | Attending: Urology | Admitting: Urology

## 2019-09-21 ENCOUNTER — Encounter: Payer: Self-pay | Admitting: Urology

## 2019-09-21 ENCOUNTER — Encounter
Admission: RE | Disposition: A | Discharge status: Home / Self Care | Payer: Self-pay | Source: Home / Self Care | Attending: Urology

## 2019-09-21 DIAGNOSIS — Z79899 Other long term (current) drug therapy: Secondary | ICD-10-CM | POA: Diagnosis not present

## 2019-09-21 DIAGNOSIS — N200 Calculus of kidney: Secondary | ICD-10-CM | POA: Diagnosis not present

## 2019-09-21 HISTORY — PX: EXTRACORPOREAL SHOCK WAVE LITHOTRIPSY: SHX1557

## 2019-09-21 SURGERY — LITHOTRIPSY, ESWL
Anesthesia: Moderate Sedation | Laterality: Right

## 2019-09-21 MED ORDER — FUROSEMIDE 10 MG/ML IJ SOLN
INTRAMUSCULAR | Status: AC
  Filled 2019-09-21: qty 2

## 2019-09-21 MED ORDER — MORPHINE SULFATE (PF) 10 MG/ML IV SOLN: 10.0000 mg | Freq: Once | INTRAVENOUS | Status: AC

## 2019-09-21 MED ORDER — DIPHENHYDRAMINE HCL 25 MG PO CAPS
ORAL_CAPSULE | ORAL | Status: AC
  Administered 2019-09-21: 11:00:00 25 mg via ORAL
  Filled 2019-09-21: qty 1

## 2019-09-21 MED ORDER — DIPHENHYDRAMINE HCL 25 MG PO CAPS: 25.0000 mg | ORAL_CAPSULE | ORAL | Status: AC

## 2019-09-21 MED ORDER — MIDAZOLAM HCL 2 MG/2ML IJ SOLN: 1.0000 mg | Freq: Once | INTRAMUSCULAR | Status: AC

## 2019-09-21 MED ORDER — HYDROCODONE-ACETAMINOPHEN 10-325 MG PO TABS: 1.0000 | ORAL_TABLET | ORAL | 0 refills | Status: DC | PRN

## 2019-09-21 MED ORDER — MORPHINE SULFATE (PF) 10 MG/ML IV SOLN
INTRAVENOUS | Status: AC
  Administered 2019-09-21: 10 mg via INTRAMUSCULAR
  Filled 2019-09-21: qty 1

## 2019-09-21 MED ORDER — DEXTROSE-NACL 5-0.45 % IV SOLN: INTRAVENOUS | Status: DC

## 2019-09-21 MED ORDER — PROMETHAZINE HCL 25 MG/ML IJ SOLN: 25.0000 mg | Freq: Once | INTRAMUSCULAR | Status: AC

## 2019-09-21 MED ORDER — ONDANSETRON 8 MG PO TBDP: 8.0000 mg | ORAL_TABLET | Freq: Four times a day (QID) | ORAL | 3 refills | Status: DC | PRN

## 2019-09-21 MED ORDER — LEVOFLOXACIN 500 MG PO TABS
ORAL_TABLET | ORAL | Status: AC
  Administered 2019-09-21: 11:00:00 500 mg via ORAL
  Filled 2019-09-21: qty 1

## 2019-09-21 MED ORDER — LEVOFLOXACIN 500 MG PO TABS: 500.0000 mg | ORAL_TABLET | ORAL | Status: AC

## 2019-09-21 MED ORDER — PROMETHAZINE HCL 25 MG/ML IJ SOLN
INTRAMUSCULAR | Status: AC
  Administered 2019-09-21: 12:00:00 25 mg via INTRAMUSCULAR
  Filled 2019-09-21: qty 1

## 2019-09-21 MED ORDER — MIDAZOLAM HCL 2 MG/2ML IJ SOLN
INTRAMUSCULAR | Status: AC
  Administered 2019-09-21: 1 mg via INTRAMUSCULAR
  Filled 2019-09-21: qty 2

## 2019-09-21 MED ORDER — FUROSEMIDE 10 MG/ML IJ SOLN
10.0000 mg | Freq: Once | INTRAMUSCULAR | Status: AC
  Administered 2019-09-21: 17:00:00 10 mg via INTRAVENOUS

## 2019-09-21 NOTE — Discharge Instructions (Addendum)
AMBULATORY SURGERY  DISCHARGE INSTRUCTIONS   1) The drugs that you were given will stay in your system until tomorrow so for the next 24 hours you should not:  A) Drive an automobile B) Make any legal decisions C) Drink any alcoholic beverage   2) You may resume regular meals tomorrow.  Today it is better to start with liquids and gradually work up to solid foods.  You may eat anything you prefer, but it is better to start with liquids, then soup and crackers, and gradually work up to solid foods.   3) Please notify your doctor immediately if you have any unusual bleeding, trouble breathing, redness and pain at the surgery site, drainage, fever, or pain not relieved by medication.    4) Additional Instructions:        Please contact your physician with any problems or Same Day Surgery at [phone], Monday through Friday 6 am to 4 pm, or [place] at [facility] number at [phone].Kidney Stones  Kidney stones are solid, rock-like deposits that form inside of the kidneys. The kidneys are a pair of organs that make urine. A kidney stone may form in a kidney and move into other parts of the urinary tract, including the tubes that connect the kidneys to the bladder (ureters), the bladder, and the tube that carries urine out of the body (urethra). As the stone moves through these areas, it can cause intense pain and block the flow of urine. Kidney stones are created when high levels of certain minerals are found in the urine. The stones are usually passed out of the body through urination, but in some cases, medical treatment may be needed to remove them. What are the causes? Kidney stones may be caused by:  A condition in which certain glands produce too much parathyroid hormone (primary hyperparathyroidism), which causes too much calcium buildup in the blood.  A buildup of uric acid crystals in the bladder (hyperuricosuria). Uric acid is a chemical that the body produces  when you eat certain foods. It usually exits the body in the urine.  Narrowing (stricture) of one or both of the ureters.  A kidney blockage that is present at birth (congenital obstruction).  Past surgery on the kidney or the ureters, such as gastric bypass surgery. What increases the risk? The following factors may make you more likely to develop this condition:  Having had a kidney stone in the past.  Having a family history of kidney stones.  Not drinking enough water.  Eating a diet that is high in protein, salt (sodium), or sugar.  Being overweight or obese. What are the signs or symptoms? Symptoms of a kidney stone may include:  Pain in the side of the abdomen, right below the ribs (flank pain). Pain usually spreads (radiates) to the groin.  Needing to urinate frequently or urgently.  Painful urination.  Blood in the urine (hematuria).  Nausea.  Vomiting.  Fever and chills. How is this diagnosed? This condition may be diagnosed based on:  Your symptoms and medical history.  A physical exam.  Blood tests.  Urine tests. These may be done before and after the stone passes out of your body through urination.  Imaging tests, such as a CT scan, abdominal X-ray, or ultrasound.  A procedure to examine the inside of the bladder (cystoscopy). How is this treated? Treatment for kidney stones depends on the size, location, and makeup of the stones. Kidney stones will often pass out of the body  through urination. You may need to:  Increase your fluid intake to help pass the stone. In some cases, you may be given fluids through an IV and may need to be monitored at the hospital.  Take medicine for pain.  Make changes in your diet to help prevent kidney stones from coming back. Sometimes, medical procedures are needed to remove a kidney stone. This may involve:  A procedure to break up kidney stones using: ? A focused beam of light (laser therapy). ? Shock waves  (extracorporeal shock wave lithotripsy).  Surgery to remove kidney stones. This may be needed if you have severe pain or have stones that block your urinary tract. Follow these instructions at home: Medicines  Take over-the-counter and prescription medicines only as told by your health care provider.  Ask your health care provider if the medicine prescribed to you requires you to avoid driving or using heavy machinery. Eating and drinking  Drink enough fluid to keep your urine pale yellow. You may be instructed to drink at least 8-10 glasses of water each day. This will help you pass the kidney stone.  If directed, change your diet. This may include: ? Limiting how much sodium you eat. ? Eating more fruits and vegetables. ? Limiting how much animal protein--such as red meat, poultry, fish, and eggs--you eat.  Follow instructions from your health care provider about eating or drinking restrictions. General instructions  Collect urine samples as told by your health care provider. You may need to collect a urine sample: ? 24 hours after you pass the stone. ? 8-12 weeks after passing the kidney stone, and every 6-12 months after that.  Strain your urine every time you urinate, for as long as directed. Use the strainer that your health care provider recommends.  Do not throw out the kidney stone after passing it. Keep the stone so it can be tested by your health care provider. Testing the makeup of your kidney stone may help prevent you from getting kidney stones in the future.  Keep all follow-up visits as told by your health care provider. This is important. You may need follow-up X-rays or ultrasounds to make sure that your stone has passed. How is this prevented? To prevent another kidney stone:  Drink enough fluid to keep your urine pale yellow. This is the best way to prevent kidney stones.  Eat a healthy diet and follow recommendations from your health care provider about foods  to avoid. You may be instructed to eat a low-protein diet. Recommendations vary depending on the type of kidney stone that you have.  Maintain a healthy weight. Where to find more information  Margaret (NKF): www.[email or web address].[email or web address]): www.urologyhealth.org Contact a health care provider if:  You have pain that gets worse or does not get better with medicine. Get help right away if:  You have a fever or chills.  You develop severe pain.  You develop new abdominal pain.  You faint.  You are unable to urinate. Summary  Kidney stones are solid, rock-like deposits that form inside of the kidneys.  Kidney stones can cause nausea, vomiting, blood in the urine, abdominal pain, and the urge to urinate frequently.  Treatment for kidney stones depends on the size, location, and makeup of the stones. Kidney stones will often pass out of the body through urination.  Kidney stones can be prevented by drinking enough fluids, eating a healthy diet, and maintaining a healthy weight. This information is  not intended to replace advice given to you by your health care provider. Make sure you discuss any questions you have with your health care provider. Document Revised: 10/04/2018 Document Reviewed: 10/04/2018 Elsevier Patient Education  [place] After This sheet gives you information about how to care for yourself after your procedure. Your health care provider may also give you more specific instructions. If you have problems or questions, contact your health care provider. What can I expect after the procedure? After the procedure, it is common to have:  Some blood in your urine. This should only last for a few days.  Soreness in your back, sides, or upper abdomen for a few days.  Blotches or bruises on your back where the pressure wave entered the skin.  Pain, discomfort, or nausea when pieces (fragments) of the  kidney stone move through the tube that carries urine from the kidney to the bladder (ureter). Stone fragments may pass soon after the procedure, but they may continue to pass for up to 4-8 weeks. ? If you have severe pain or nausea, contact your health care provider. This may be caused by a large stone that was not broken up, and this may mean that you need more treatment.  Some pain or discomfort during urination.  Some pain or discomfort in the lower abdomen or (in men) at the base of the penis. Follow these instructions at home: Medicines  Take over-the-counter and prescription medicines only as told by your health care provider.  If you were prescribed an antibiotic medicine, take it as told by your health care provider. Do not stop taking the antibiotic even if you start to feel better.  Do not drive for 24 hours if you were given a medicine to help you relax (sedative).  Do not drive or use heavy machinery while taking prescription pain medicine. Eating and drinking      Drink enough water and fluids to keep your urine clear or pale yellow. This helps any remaining pieces of the stone to pass. It can also help prevent new stones from forming.  Eat plenty of fresh fruits and vegetables.  Follow instructions from your health care provider about eating and drinking restrictions. You may be instructed: ? To reduce how much salt (sodium) you eat or drink. Check ingredients and nutrition facts on packaged foods and beverages. ? To reduce how much meat you eat.  Eat the recommended amount of calcium for your age and gender. Ask your health care provider how much calcium you should have. General instructions  Get plenty of rest.  Most people can resume normal activities 1-2 days after the procedure. Ask your health care provider what activities are safe for you.  Your health care provider may direct you to lie in a certain position (postural drainage) and tap firmly (percuss) over  your kidney area to help stone fragments pass. Follow instructions as told by your health care provider.  If directed, strain all urine through the strainer that was provided by your health care provider. ? Keep all fragments for your health care provider to see. Any stones that are found may be sent to a medical lab for examination. The stone may be as small as a grain of salt.  Keep all follow-up visits as told by your health care provider. This is important. Contact a health care provider if:  You have pain that is severe or does not get better with medicine.  You have nausea  that is severe or does not go away.  You have blood in your urine longer than your health care provider told you to expect.  You have more blood in your urine.  You have pain during urination that does not go away.  You urinate more frequently than usual and this does not go away.  You develop a rash or any other possible signs of an allergic reaction. Get help right away if:  You have severe pain in your back, sides, or upper abdomen.  You have severe pain while urinating.  Your urine is very dark red.  You have blood in your stool (feces).  You cannot pass any urine at all.  You feel a strong urge to urinate after emptying your bladder.  You have a fever or chills.  You develop shortness of breath, difficulty breathing, or chest pain.  You have severe nausea that leads to persistent vomiting.  You faint. Summary  After this procedure, it is common to have some pain, discomfort, or nausea when pieces (fragments) of the kidney stone move through the tube that carries urine from the kidney to the bladder (ureter). If this pain or nausea is severe, however, you should contact your health care provider.  Most people can resume normal activities 1-2 days after the procedure. Ask your health care provider what activities are safe for you.  Drink enough water and fluids to keep your urine clear or  pale yellow. This helps any remaining pieces of the stone to pass, and it can help prevent new stones from forming.  If directed, strain your urine and keep all fragments for your health care provider to see. Fragments or stones may be as small as a grain of salt.  Get help right away if you have severe pain in your back, sides, or upper abdomen or have severe pain while urinating. This information is not intended to replace advice given to you by your health care provider. Make sure you discuss any questions you have with your health care provider. Document Revised: 08/29/2018 Document Reviewed: 04/08/2016 Elsevier Patient Education  2020 [employer].

## 2020-06-12 ENCOUNTER — Other Ambulatory Visit: Payer: BC Managed Care – PPO

## 2020-07-24 ENCOUNTER — Other Ambulatory Visit: Admission: RE | Admit: 2020-07-24 | Payer: BC Managed Care – PPO | Source: Ambulatory Visit
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
## 2020-09-26 ENCOUNTER — Encounter: Payer: Self-pay | Admitting: General Surgery

## 2020-09-27 ENCOUNTER — Ambulatory Visit: Payer: BC Managed Care – PPO | Admitting: Anesthesiology

## 2020-09-27 ENCOUNTER — Encounter: Payer: Self-pay | Admitting: General Surgery

## 2020-09-27 ENCOUNTER — Encounter
Admission: RE | Disposition: A | Discharge status: Home / Self Care | Payer: Self-pay | Source: Home / Self Care | Attending: Gastroenterology

## 2020-09-27 ENCOUNTER — Ambulatory Visit
Admission: RE | Admit: 2020-09-27 | Discharge: 2020-09-27 | Disposition: A | Discharge status: Home / Self Care | Payer: BC Managed Care – PPO | Attending: Gastroenterology | Admitting: Gastroenterology

## 2020-09-27 DIAGNOSIS — Z791 Long term (current) use of non-steroidal anti-inflammatories (NSAID): Secondary | ICD-10-CM | POA: Insufficient documentation

## 2020-09-27 DIAGNOSIS — D1779 Benign lipomatous neoplasm of other sites: Secondary | ICD-10-CM | POA: Diagnosis not present

## 2020-09-27 DIAGNOSIS — K640 First degree hemorrhoids: Secondary | ICD-10-CM | POA: Diagnosis not present

## 2020-09-27 DIAGNOSIS — Z79899 Other long term (current) drug therapy: Secondary | ICD-10-CM | POA: Diagnosis not present

## 2020-09-27 DIAGNOSIS — Z1211 Encounter for screening for malignant neoplasm of colon: Secondary | ICD-10-CM | POA: Insufficient documentation

## 2020-09-27 DIAGNOSIS — D127 Benign neoplasm of rectosigmoid junction: Secondary | ICD-10-CM | POA: Insufficient documentation

## 2020-09-27 HISTORY — PX: COLONOSCOPY WITH PROPOFOL: SHX5780

## 2020-09-27 SURGERY — COLONOSCOPY WITH PROPOFOL
Anesthesia: General

## 2020-09-27 MED ORDER — LIDOCAINE HCL (CARDIAC) PF 100 MG/5ML IV SOSY
PREFILLED_SYRINGE | INTRAVENOUS | Status: DC | PRN
  Administered 2020-09-27: 100 mg via INTRAVENOUS

## 2020-09-27 MED ORDER — PROPOFOL 500 MG/50ML IV EMUL
INTRAVENOUS | Status: DC | PRN
  Administered 2020-09-27: 185 ug/kg/min via INTRAVENOUS
  Administered 2020-09-27: 165 ug/kg/min via INTRAVENOUS

## 2020-09-27 MED ORDER — PROPOFOL 10 MG/ML IV BOLUS
INTRAVENOUS | Status: DC | PRN
  Administered 2020-09-27: 70 mg via INTRAVENOUS
  Administered 2020-09-27: 20 mg via INTRAVENOUS
  Administered 2020-09-27: 10 mg via INTRAVENOUS
  Administered 2020-09-27: 20 mg via INTRAVENOUS

## 2020-09-27 MED ORDER — SODIUM CHLORIDE 0.9 % IV SOLN
INTRAVENOUS | Status: DC
  Administered 2020-09-27: 1000 mL via INTRAVENOUS
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
## 2020-09-27 NOTE — Op Note (Signed)
[facility] Gastroenterology Patient Name: Selso Mannor Procedure Date: 09/27/2020 10:10 AM MRN: [id number] Account #: [id number] Date of Birth: 1966/03/08 Admit Type: Outpatient Age: 55 Room: [facility] ENDO ROOM 3 Gender: Male Note Status: Finalized Procedure:             Colonoscopy Indications:           Screening for colorectal malignant neoplasm Providers:             Andrey Farmer MD, MD Referring MD:          Irven Easterly. Kary Kos, MD (Referring MD) Medicines:             Monitored Anesthesia Care Complications:         No immediate complications. Estimated blood loss:                         Minimal. Procedure:             Pre-Anesthesia Assessment:                        - Prior to the procedure, a History and Physical was                         performed, and patient medications and allergies were                         reviewed. The patient is competent. The risks and                         benefits of the procedure and the sedation options and                         risks were discussed with the patient. All questions                         were answered and informed consent was obtained.                         Patient identification and proposed procedure were                         verified by the physician, the nurse, the anesthetist                         and the technician in the endoscopy suite. Mental                         Status Examination: alert and oriented. Airway                         Examination: normal oropharyngeal airway and neck                         mobility. Respiratory Examination: clear to                         auscultation. CV Examination: normal. Prophylactic  Antibiotics: The patient does not require prophylactic                         antibiotics. Prior Anticoagulants: The patient has                         taken no previous anticoagulant or antiplatelet                         agents. ASA  Grade Assessment: II - A patient with mild                         systemic disease. After reviewing the risks and                         benefits, the patient was deemed in satisfactory                         condition to undergo the procedure. The anesthesia                         plan was to use monitored anesthesia care (MAC).                         Immediately prior to administration of medications,                         the patient was re-assessed for adequacy to receive                         sedatives. The heart rate, respiratory rate, oxygen                         saturations, blood pressure, adequacy of pulmonary                         ventilation, and response to care were monitored                         throughout the procedure. The physical status of the                         patient was re-assessed after the procedure.                        After obtaining informed consent, the colonoscope was                         passed under direct vision. Throughout the procedure,                         the patient's blood pressure, pulse, and oxygen                         saturations were monitored continuously. The                         Colonoscope was introduced through the anus and  advanced to the the cecum, identified by appendiceal                         orifice and ileocecal valve. The colonoscopy was                         performed without difficulty. The patient tolerated                         the procedure well. The quality of the bowel                         preparation was adequate to identify polyps. Findings:      The perianal and digital rectal examinations were normal.      A 6 mm polyp was found in the sigmoid colon. The polyp was       semi-pedunculated. The polyp was removed with a cold snare. Resection       and retrieval were complete. Estimated blood loss was minimal.      A 1 mm polyp was found in the sigmoid colon.  The polyp was sessile. The       polyp was removed with a jumbo cold forceps. Resection and retrieval       were complete. Estimated blood loss was minimal.      A 3 mm polyp was found in the recto-sigmoid colon. The polyp was       sessile. The polyp was removed with a cold snare. Resection and       retrieval were complete. Estimated blood loss was minimal.      Internal hemorrhoids were found during retroflexion. The hemorrhoids       were Grade I (internal hemorrhoids that do not prolapse).      The exam was otherwise without abnormality on direct and retroflexion       views. Impression:            - One 6 mm polyp in the sigmoid colon, removed with a                         cold snare. Resected and retrieved.                        - One 1 mm polyp in the sigmoid colon, removed with a                         jumbo cold forceps. Resected and retrieved.                        - One 3 mm polyp at the recto-sigmoid colon, removed                         with a cold snare. Resected and retrieved.                        - Internal hemorrhoids.                        - The examination was otherwise normal on direct and  retroflexion views. Recommendation:        - Discharge patient to home.                        - Resume previous diet.                        - Continue present medications.                        - Await pathology results.                        - Repeat colonoscopy for surveillance based on                         pathology results.                        - Return to referring physician as previously                         scheduled. Procedure Code(s):     --- Professional ---                        [phone], Colonoscopy, flexible; with removal of                         tumor(s), polyp(s), or other lesion(s) by snare                         technique                        45380, 8, Colonoscopy, flexible; with biopsy, single                          or multiple Diagnosis Code(s):     --- Professional ---                        Z12.11, Encounter for screening for malignant neoplasm                         of colon                        K63.5, Polyp of colon                        K64.0, First degree hemorrhoids CPT copyright 2019 American Medical Association. All rights reserved. The codes documented in this report are preliminary and upon coder review may  be revised to meet current compliance requirements. Andrey Farmer MD, MD 09/27/2020 11:04:58 AM Number of Addenda: 0 Note Initiated On: 09/27/2020 10:10 AM Scope Withdrawal Time: 0 hours 16 minutes 33 seconds  Total Procedure Duration: 0 hours 20 minutes 26 seconds  Estimated Blood Loss:  Estimated blood loss was minimal.      [facility]
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
## 2020-09-27 NOTE — Transfer of Care (Signed)
Immediate Anesthesia Transfer of Care Note  Patient: Michael York  Procedure(s) Performed: COLONOSCOPY WITH PROPOFOL (N/A )  Patient Location: Endoscopy Unit  Anesthesia Type:General  Level of Consciousness: drowsy and patient cooperative  Airway & Oxygen Therapy: Patient Spontanous Breathing and Patient connected to face mask oxygen  Post-op Assessment: Report given to RN and Post -op Vital signs reviewed and stable  Post vital signs: Reviewed and stable  Last Vitals:  Vitals Value Taken Time  BP 108/80 09/27/20 1105  Temp 36.1 C 09/27/20 1105  Pulse 74 09/27/20 1105  Resp 13 09/27/20 1105  SpO2 100 % 09/27/20 1105    Last Pain:  Vitals:   09/27/20 1105  TempSrc: Temporal  PainSc: 0-No pain         Complications: No complications documented.

## 2020-09-27 NOTE — Anesthesia Procedure Notes (Signed)
Procedure Name: General with mask airway Performed by: Fletcher-Harrison, Chad Donoghue, CRNA Pre-anesthesia Checklist: Patient identified, Emergency Drugs available, Suction available and Patient being monitored Patient Re-evaluated:Patient Re-evaluated prior to induction Oxygen Delivery Method: Simple face mask Induction Type: IV induction Placement Confirmation: positive ETCO2 and CO2 detector Dental Injury: Teeth and Oropharynx as per pre-operative assessment        
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
## 2020-09-27 NOTE — Anesthesia Postprocedure Evaluation (Signed)
Anesthesia Post Note  Patient: Michael York  Procedure(s) Performed: COLONOSCOPY WITH PROPOFOL (N/A )  Patient location during evaluation: Endoscopy Anesthesia Type: General Level of consciousness: awake and alert Pain management: pain level controlled Vital Signs Assessment: post-procedure vital signs reviewed and stable Respiratory status: spontaneous breathing, nonlabored ventilation, respiratory function stable and patient connected to nasal cannula oxygen Cardiovascular status: blood pressure returned to baseline and stable Postop Assessment: no apparent nausea or vomiting Anesthetic complications: no   No complications documented.   Last Vitals:  Vitals:   09/27/20 1125 09/27/20 1126  BP: (!) 137/102 (!) 135/95  Pulse: 69 68  Resp: 10 12  Temp:    SpO2: 100% 100%    Last Pain:  Vitals:   09/27/20 1126  TempSrc:   PainSc: 0-No pain                 Arita Miss

## 2020-09-30 ENCOUNTER — Encounter: Payer: Self-pay | Admitting: Gastroenterology

## 2020-09-30 LAB — SURGICAL PATHOLOGY

## 2021-02-24 ENCOUNTER — Other Ambulatory Visit (HOSPITAL_COMMUNITY): Payer: Self-pay | Admitting: Urology

## 2021-02-24 ENCOUNTER — Other Ambulatory Visit: Payer: Self-pay | Admitting: Urology

## 2021-02-24 DIAGNOSIS — R319 Hematuria, unspecified: Secondary | ICD-10-CM

## 2021-03-12 MED ORDER — MIDAZOLAM HCL 2 MG/2ML IJ SOLN: 1.0000 mg | Freq: Once | INTRAMUSCULAR | Status: AC

## 2021-03-12 MED ORDER — DIPHENHYDRAMINE HCL 25 MG PO CAPS: 25.0000 mg | ORAL_CAPSULE | Freq: Once | ORAL | Status: AC

## 2021-03-12 MED ORDER — DEXTROSE-NACL 5-0.45 % IV SOLN: INTRAVENOUS | Status: DC

## 2021-03-12 MED ORDER — MORPHINE SULFATE (PF) 10 MG/ML IV SOLN: 10.0000 mg | Freq: Once | INTRAVENOUS | Status: AC

## 2021-03-12 MED ORDER — PROMETHAZINE HCL 25 MG/ML IJ SOLN: 25.0000 mg | Freq: Once | INTRAMUSCULAR | Status: AC

## 2021-03-12 MED ORDER — LEVOFLOXACIN 500 MG PO TABS: 500.0000 mg | ORAL_TABLET | Freq: Once | ORAL | Status: AC

## 2021-03-13 ENCOUNTER — Other Ambulatory Visit: Payer: Self-pay

## 2021-03-13 ENCOUNTER — Encounter: Payer: Self-pay | Admitting: Urology

## 2021-03-13 ENCOUNTER — Ambulatory Visit
Admission: RE | Admit: 2021-03-13 | Discharge: 2021-03-13 | Disposition: A | Discharge status: Home / Self Care | Payer: BC Managed Care – PPO | Attending: Urology | Admitting: Urology

## 2021-03-13 ENCOUNTER — Encounter
Admission: RE | Disposition: A | Discharge status: Home / Self Care | Payer: Self-pay | Source: Home / Self Care | Attending: Urology

## 2021-03-13 DIAGNOSIS — N200 Calculus of kidney: Secondary | ICD-10-CM | POA: Diagnosis present

## 2021-03-13 HISTORY — PX: EXTRACORPOREAL SHOCK WAVE LITHOTRIPSY: SHX1557

## 2021-03-13 SURGERY — LITHOTRIPSY, ESWL
Anesthesia: Moderate Sedation | Laterality: Right

## 2021-03-13 MED ORDER — PROMETHAZINE HCL 25 MG/ML IJ SOLN
INTRAMUSCULAR | Status: AC
  Administered 2021-03-13: 25 mg via INTRAMUSCULAR
  Filled 2021-03-13: qty 1

## 2021-03-13 MED ORDER — MIDAZOLAM HCL 2 MG/2ML IJ SOLN
INTRAMUSCULAR | Status: AC
  Administered 2021-03-13: 1 mg via INTRAMUSCULAR
  Filled 2021-03-13: qty 2

## 2021-03-13 MED ORDER — FUROSEMIDE 10 MG/ML IJ SOLN: 10.0000 mg | Freq: Once | INTRAMUSCULAR | Status: DC

## 2021-03-13 MED ORDER — DIPHENHYDRAMINE HCL 25 MG PO CAPS
ORAL_CAPSULE | ORAL | Status: AC
  Administered 2021-03-13: 25 mg via ORAL
  Filled 2021-03-13: qty 1

## 2021-03-13 MED ORDER — LEVOFLOXACIN 500 MG PO TABS
ORAL_TABLET | ORAL | Status: AC
  Administered 2021-03-13: 500 mg via ORAL
  Filled 2021-03-13: qty 1

## 2021-03-13 MED ORDER — MORPHINE SULFATE (PF) 10 MG/ML IV SOLN
INTRAVENOUS | Status: AC
  Administered 2021-03-13: 10 mg via INTRAMUSCULAR
  Filled 2021-03-13: qty 1

## 2021-03-13 NOTE — Discharge Instructions (Addendum)
Lithotripsy, Care After This sheet gives you information about how to care for yourself after your procedure. Your health care provider may also give you more specific instructions. If you have problems or questions, contact your health care provider. What can I expect after the procedure? After the procedure, it is common to have: Some blood in your urine. This should only last for a few days. Soreness in your back, sides, or upper abdomen for a few days. Blotches or bruises on the area where the shock wave entered the skin. Pain, discomfort, or nausea when pieces (fragments) of the kidney stone move through the tube that carries urine from the kidney to the bladder (ureter). Stone fragments may pass soon after the procedure, but they may continue to pass for up to 4-8 weeks. If you have severe pain or nausea, contact your health care provider. This may be caused by a large stone that was not broken up, and this may mean that you need more treatment. Some pain or discomfort during urination. Some pain or discomfort in the lower abdomen or (in men) at the base of the penis. Follow these instructions at home: Medicines Take over-the-counter and prescription medicines only as told by your health care provider. If you were prescribed an antibiotic medicine, take it as told by your health care provider. Do not stop taking the antibiotic even if you start to feel better. Ask your health care provider if the medicine prescribed to you requires you to avoid driving or using machinery. Eating and drinking    Drink enough fluid to keep your urine pale yellow. This helps any remaining pieces of the stone to pass. It can also help prevent new stones from forming. Eat plenty of fresh fruits and vegetables. Follow instructions from your health care provider about eating or drinking restrictions. You may be instructed to: Reduce how much salt (sodium) you eat or drink. Check ingredients and nutrition facts  on packaged foods and beverages to see how much sodium they contain. Reduce how much meat you eat. Eat the recommended amount of calcium for your age and gender. Ask your health care provider how much calcium you should have. General instructions Get plenty of rest. Return to your normal activities as told by your health care provider. Ask your health care provider what activities are safe for you. Most people can resume normal activities 1-2 days after the procedure. If you were given a sedative during the procedure, it can affect you for several hours. Do not drive or operate machinery until your health care provider says that it is safe. Your health care provider may direct you to lie in a certain position (postural drainage) and tap firmly (percuss) over your kidney area to help stone fragments pass. Follow instructions as told by your health care provider. If directed, strain all urine through the strainer that was provided by your health care provider. Keep all fragments for your health care provider to see. Any stones that are found may be sent to a medical lab for examination. The stone may be as small as a grain of salt. Keep all follow-up visits as told by your health care provider. This is important. Contact a health care provider if: You have a fever or chills. You have nausea that is severe or does not go away. You have any of these urinary symptoms: Blood in your urine for longer than your health care provider told you to expect. Urine that smells bad or unusual. Feeling a   strong urge to urinate after emptying your bladder. Pain or burning with urination that does not go away. Urinating more often than usual and this does not go away. You have a stent and it comes out. Get help right away if: You have severe pain in your back, sides, or upper abdomen. You have any of these urinary symptoms: Severe pain while urinating. More blood in your urine or having blood in your urine when  you did not before. Passing blood clots in your urine. Passing only a small amount of urine or being unable to pass any urine at all. You have severe nausea that leads to persistent vomiting. You faint. Summary After this procedure, it is common to have some pain, discomfort, or nausea when pieces (fragments) of the kidney stone move through the tube that carries urine from the kidney to the bladder (ureter). If this pain or nausea is severe, however, you should contact your health care provider. Return to your normal activities as told by your health care provider. Ask your health care provider what activities are safe for you. Drink enough fluid to keep your urine pale yellow. This helps any remaining pieces of the stone to pass, and it can help prevent new stones from forming. If directed, strain your urine and keep all fragments for your health care provider to see. Fragments or stones may be as small as a grain of salt. Get help right away if you have severe pain in your back, sides, or upper abdomen, or if you have severe pain while urinating. This information is not intended to replace advice given to you by your health care provider. Make sure you discuss any questions you have with your health care provider. Document Revised: 02/28/2019 Document Reviewed: 03/01/2019 Elsevier Patient Education  2022 [place]   The drugs that you were given will stay in your system until tomorrow so for the next 24 hours you should not:  Drive an automobile Make any legal decisions Drink any alcoholic beverage   You may resume regular meals tomorrow.  Today it is better to start with liquids and gradually work up to solid foods.  You may eat anything you prefer, but it is better to start with liquids, then soup and crackers, and gradually work up to solid foods.   Please notify your doctor immediately if you have any unusual bleeding, trouble  breathing, redness and pain at the surgery site, drainage, fever, or pain not relieved by medication.    Additional Instructions:  Please contact your physician with any problems or Same Day Surgery at [phone], Monday through Friday 6 am to 4 pm, or [place] at [facility] number at [phone].

## 2021-03-17 ENCOUNTER — Ambulatory Visit: Payer: BC Managed Care – PPO

## 2021-03-20 ENCOUNTER — Encounter
Admission: RE | Disposition: A | Discharge status: Home / Self Care | Payer: Self-pay | Source: Home / Self Care | Attending: Urology

## 2021-03-20 ENCOUNTER — Ambulatory Visit
Admission: RE | Admit: 2021-03-20 | Discharge: 2021-03-20 | Disposition: A | Discharge status: Home / Self Care | Payer: BC Managed Care – PPO | Attending: Urology | Admitting: Urology

## 2021-03-20 ENCOUNTER — Encounter: Payer: Self-pay | Admitting: Urology

## 2021-03-20 DIAGNOSIS — Z79899 Other long term (current) drug therapy: Secondary | ICD-10-CM | POA: Insufficient documentation

## 2021-03-20 DIAGNOSIS — Z79891 Long term (current) use of opiate analgesic: Secondary | ICD-10-CM | POA: Insufficient documentation

## 2021-03-20 DIAGNOSIS — I10 Essential (primary) hypertension: Secondary | ICD-10-CM | POA: Insufficient documentation

## 2021-03-20 DIAGNOSIS — N202 Calculus of kidney with calculus of ureter: Secondary | ICD-10-CM | POA: Insufficient documentation

## 2021-03-20 DIAGNOSIS — N201 Calculus of ureter: Secondary | ICD-10-CM

## 2021-03-20 DIAGNOSIS — Z791 Long term (current) use of non-steroidal anti-inflammatories (NSAID): Secondary | ICD-10-CM | POA: Insufficient documentation

## 2021-03-20 HISTORY — PX: EXTRACORPOREAL SHOCK WAVE LITHOTRIPSY: SHX1557

## 2021-03-20 SURGERY — LITHOTRIPSY, ESWL
Anesthesia: Moderate Sedation | Laterality: Left

## 2021-03-20 MED ORDER — MORPHINE SULFATE (PF) 10 MG/ML IV SOLN
INTRAVENOUS | Status: AC
  Administered 2021-03-20: 10 mg via INTRAVENOUS
  Filled 2021-03-20: qty 1

## 2021-03-20 MED ORDER — FUROSEMIDE 10 MG/ML IJ SOLN
10.0000 mg | Freq: Once | INTRAMUSCULAR | Status: AC
  Administered 2021-03-20: 10 mg via INTRAVENOUS

## 2021-03-20 MED ORDER — MIDAZOLAM HCL 2 MG/2ML IJ SOLN
INTRAMUSCULAR | Status: AC
  Administered 2021-03-20: 1 mg via INTRAVENOUS
  Filled 2021-03-20: qty 2

## 2021-03-20 MED ORDER — LEVOFLOXACIN 500 MG PO TABS
ORAL_TABLET | ORAL | Status: AC
  Administered 2021-03-20: 500 mg via ORAL
  Filled 2021-03-20: qty 1

## 2021-03-20 MED ORDER — PROMETHAZINE HCL 25 MG/ML IJ SOLN: 25.0000 mg | INTRAMUSCULAR | Status: AC

## 2021-03-20 MED ORDER — DIPHENHYDRAMINE HCL 25 MG PO CAPS: 25.0000 mg | ORAL_CAPSULE | ORAL | Status: AC

## 2021-03-20 MED ORDER — MIDAZOLAM HCL 2 MG/2ML IJ SOLN: 1.0000 mg | INTRAMUSCULAR | Status: AC

## 2021-03-20 MED ORDER — MORPHINE SULFATE (PF) 10 MG/ML IV SOLN
10.0000 mg | INTRAVENOUS | Status: AC
Start: 2021-03-20 — End: 2021-03-20

## 2021-03-20 MED ORDER — DIPHENHYDRAMINE HCL 25 MG PO CAPS
ORAL_CAPSULE | ORAL | Status: AC
  Administered 2021-03-20: 25 mg via ORAL
  Filled 2021-03-20: qty 1

## 2021-03-20 MED ORDER — DEXTROSE-NACL 5-0.45 % IV SOLN: INTRAVENOUS | Status: DC

## 2021-03-20 MED ORDER — FUROSEMIDE 10 MG/ML IJ SOLN
INTRAMUSCULAR | Status: AC
  Filled 2021-03-20: qty 2

## 2021-03-20 MED ORDER — LEVOFLOXACIN 500 MG PO TABS: 500.0000 mg | ORAL_TABLET | ORAL | Status: AC

## 2021-03-20 MED ORDER — PROMETHAZINE HCL 25 MG/ML IJ SOLN
INTRAMUSCULAR | Status: AC
  Administered 2021-03-20: 25 mg via INTRAMUSCULAR
  Filled 2021-03-20: qty 1

## 2021-03-20 NOTE — Discharge Instructions (Addendum)
Lithotripsy, Care After This sheet gives you information about how to care for yourself after your procedure. Your health care provider may also give you more specific instructions. If you have problems or questions, contact your health care provider. What can I expect after the procedure? After the procedure, it is common to have: Some blood in your urine. This should only last for a few days. Soreness in your back, sides, or upper abdomen for a few days. Blotches or bruises on the area where the shock wave entered the skin. Pain, discomfort, or nausea when pieces (fragments) of the kidney stone move through the tube that carries urine from the kidney to the bladder (ureter). Stone fragments may pass soon after the procedure, but they may continue to pass for up to 4-8 weeks. If you have severe pain or nausea, contact your health care provider. This may be caused by a large stone that was not broken up, and this may mean that you need more treatment. Some pain or discomfort during urination. Some pain or discomfort in the lower abdomen or (in men) at the base of the penis. Follow these instructions at home: Medicines Take over-the-counter and prescription medicines only as told by your health care provider. If you were prescribed an antibiotic medicine, take it as told by your health care provider. Do not stop taking the antibiotic even if you start to feel better. Ask your health care provider if the medicine prescribed to you requires you to avoid driving or using machinery. Eating and drinking    Drink enough fluid to keep your urine pale yellow. This helps any remaining pieces of the stone to pass. It can also help prevent new stones from forming. Eat plenty of fresh fruits and vegetables. Follow instructions from your health care provider about eating or drinking restrictions. You may be instructed to: Reduce how much salt (sodium) you eat or drink. Check ingredients and nutrition facts  on packaged foods and beverages to see how much sodium they contain. Reduce how much meat you eat. Eat the recommended amount of calcium for your age and gender. Ask your health care provider how much calcium you should have. General instructions Get plenty of rest. Return to your normal activities as told by your health care provider. Ask your health care provider what activities are safe for you. Most people can resume normal activities 1-2 days after the procedure. If you were given a sedative during the procedure, it can affect you for several hours. Do not drive or operate machinery until your health care provider says that it is safe. Your health care provider may direct you to lie in a certain position (postural drainage) and tap firmly (percuss) over your kidney area to help stone fragments pass. Follow instructions as told by your health care provider. If directed, strain all urine through the strainer that was provided by your health care provider. Keep all fragments for your health care provider to see. Any stones that are found may be sent to a medical lab for examination. The stone may be as small as a grain of salt. Keep all follow-up visits as told by your health care provider. This is important. Contact a health care provider if: You have a fever or chills. You have nausea that is severe or does not go away. You have any of these urinary symptoms: Blood in your urine for longer than your health care provider told you to expect. Urine that smells bad or unusual. Feeling a   strong urge to urinate after emptying your bladder. Pain or burning with urination that does not go away. Urinating more often than usual and this does not go away. You have a stent and it comes out. Get help right away if: You have severe pain in your back, sides, or upper abdomen. You have any of these urinary symptoms: Severe pain while urinating. More blood in your urine or having blood in your urine when  you did not before. Passing blood clots in your urine. Passing only a small amount of urine or being unable to pass any urine at all. You have severe nausea that leads to persistent vomiting. You faint. Summary After this procedure, it is common to have some pain, discomfort, or nausea when pieces (fragments) of the kidney stone move through the tube that carries urine from the kidney to the bladder (ureter). If this pain or nausea is severe, however, you should contact your health care provider. Return to your normal activities as told by your health care provider. Ask your health care provider what activities are safe for you. Drink enough fluid to keep your urine pale yellow. This helps any remaining pieces of the stone to pass, and it can help prevent new stones from forming. If directed, strain your urine and keep all fragments for your health care provider to see. Fragments or stones may be as small as a grain of salt. Get help right away if you have severe pain in your back, sides, or upper abdomen, or if you have severe pain while urinating. This information is not intended to replace advice given to you by your health care provider. Make sure you discuss any questions you have with your health care provider. Document Revised: 02/28/2019 Document Reviewed: 03/01/2019 Elsevier Patient Education  2022 [place]   The drugs that you were given will stay in your system until tomorrow so for the next 24 hours you should not:  Drive an automobile Make any legal decisions Drink any alcoholic beverage   You may resume regular meals tomorrow.  Today it is better to start with liquids and gradually work up to solid foods.  You may eat anything you prefer, but it is better to start with liquids, then soup and crackers, and gradually work up to solid foods.   Please notify your doctor immediately if you have any unusual bleeding, trouble  breathing, redness and pain at the surgery site, drainage, fever, or pain not relieved by medication.     Your post-operative visit with Dr.                                       is: Date:                        Time:    Please call to schedule your post-operative visit.  Additional Instructions:

## 2021-03-21 ENCOUNTER — Encounter: Payer: Self-pay | Admitting: Urology

## 2021-04-17 ENCOUNTER — Encounter: Payer: Self-pay | Admitting: Urology

## 2022-05-27 ENCOUNTER — Other Ambulatory Visit: Payer: Self-pay

## 2022-05-27 ENCOUNTER — Emergency Department: Payer: BC Managed Care – PPO

## 2022-05-27 ENCOUNTER — Emergency Department
Admission: EM | Admit: 2022-05-27 | Discharge: 2022-05-27 | Disposition: A | Discharge status: Home / Self Care | Payer: BC Managed Care – PPO | Attending: Emergency Medicine | Admitting: Emergency Medicine

## 2022-05-27 DIAGNOSIS — N200 Calculus of kidney: Secondary | ICD-10-CM | POA: Insufficient documentation

## 2022-05-27 DIAGNOSIS — R109 Unspecified abdominal pain: Secondary | ICD-10-CM | POA: Diagnosis present

## 2022-05-27 LAB — CBC
HCT: 48.5 % (ref 39.0–52.0)
Hemoglobin: 16.4 g/dL (ref 13.0–17.0)
MCH: 29.2 pg (ref 26.0–34.0)
MCHC: 33.8 g/dL (ref 30.0–36.0)
MCV: 86.3 fL (ref 80.0–100.0)
Platelets: 269 10*3/uL (ref 150–400)
RBC: 5.62 MIL/uL (ref 4.22–5.81)
RDW: 13.4 % (ref 11.5–15.5)
WBC: 4.9 10*3/uL (ref 4.0–10.5)
nRBC: 0 % (ref 0.0–0.2)

## 2022-05-27 LAB — URINALYSIS, ROUTINE W REFLEX MICROSCOPIC
Bilirubin Urine: NEGATIVE
Glucose, UA: NEGATIVE mg/dL
Ketones, ur: NEGATIVE mg/dL
Leukocytes,Ua: NEGATIVE
Nitrite: NEGATIVE
Protein, ur: 30 mg/dL — AB
RBC / HPF: >50 RBC/hpf — ABNORMAL HIGH (ref 0–5)
Specific Gravity, Urine: 1.02 (ref 1.005–1.030)
pH: 5 (ref 5.0–8.0)

## 2022-05-27 LAB — BASIC METABOLIC PANEL
Anion gap: 10 (ref 5–15)
BUN: 21 mg/dL — ABNORMAL HIGH (ref 6–20)
CO2: 24 mmol/L (ref 22–32)
Calcium: 9.6 mg/dL (ref 8.9–10.3)
Chloride: 108 mmol/L (ref 98–111)
Creatinine, Ser: 0.97 mg/dL (ref 0.61–1.24)
GFR, Estimated: >60 mL/min (ref >60)
Glucose, Bld: 130 mg/dL — ABNORMAL HIGH (ref 70–99)
Potassium: 4 mmol/L (ref 3.5–5.1)
Sodium: 142 mmol/L (ref 135–145)

## 2022-05-27 MED ORDER — TAMSULOSIN HCL 0.4 MG PO CAPS: 0.4000 mg | ORAL_CAPSULE | Freq: Every day | ORAL | 0 refills | Status: AC

## 2022-05-27 MED ORDER — OXYCODONE HCL 5 MG PO TABS: 5.0000 mg | ORAL_TABLET | Freq: Four times a day (QID) | ORAL | 0 refills | Status: AC | PRN

## 2022-05-27 MED ORDER — KETOROLAC TROMETHAMINE 30 MG/ML IJ SOLN
30.0000 mg | Freq: Once | INTRAMUSCULAR | Status: AC
  Administered 2022-05-27: 30 mg via INTRAVENOUS

## 2022-05-27 MED ORDER — CIPROFLOXACIN HCL 500 MG PO TABS: 500.0000 mg | ORAL_TABLET | Freq: Two times a day (BID) | ORAL | 0 refills | Status: AC

## 2022-05-27 MED ORDER — KETOROLAC TROMETHAMINE 30 MG/ML IJ SOLN: 30.0000 mg | Freq: Once | INTRAMUSCULAR | Status: DC

## 2022-05-27 MED ORDER — ONDANSETRON HCL 4 MG/2ML IJ SOLN
4.0000 mg | Freq: Once | INTRAMUSCULAR | Status: AC
  Administered 2022-05-27: 4 mg via INTRAVENOUS
  Filled 2022-05-27: qty 2

## 2022-05-27 NOTE — ED Provider Notes (Signed)
[facility] Provider Note    None    (approximate)   History   Flank Pain   HPI  Michael York is a 56 y.o. male presents to the ED with complaint of left flank pain since 6 AM today.  Patient has nausea and vomiting.  He has a history of kidney stones.  He went to his urology office and there was a sign on the door saying that his urologist had retired.     Physical Exam   Triage Vital Signs: ED Triage Vitals  Enc Vitals Group     BP 05/27/22 0957 (!) 181/123     Pulse Rate 05/27/22 0957 70     Resp 05/27/22 0957 20     Temp 05/27/22 0957 98.2 F (36.8 C)     Temp src --      SpO2 05/27/22 0957 98 %     Weight 05/27/22 0959 179 lb 14.3 oz (81.6 kg)     Height 05/27/22 0959 '5\' 9"'$  (1.753 m)     Head Circumference --      Peak Flow --      Pain Score 05/27/22 0959 10     Pain Loc --      Pain Edu? --      Excl. in [place]? --     Most recent vital signs: Vitals:   05/27/22 0957 05/27/22 1243  BP: (!) 181/123 (!) 162/84  Pulse: 70 74  Resp: 20 16  Temp: 98.2 F (36.8 C)   SpO2: 98% 98%     General: Awake, no distress.  Uncomfortable presenting. CV:  Good peripheral perfusion.  Resp:  Normal effort.  Lungs are clear bilaterally. Abd:  No distention.  Other:     ED Results / Procedures / Treatments   Labs (all labs ordered are listed, but only abnormal results are displayed) Labs Reviewed  URINALYSIS, ROUTINE W REFLEX MICROSCOPIC - Abnormal; Notable for the following components:      Result Value   Color, Urine YELLOW (*)    APPearance CLOUDY (*)    Hgb urine dipstick LARGE (*)    Protein, ur 30 (*)    RBC / HPF >50 (*)    Bacteria, UA RARE (*)    All other components within normal limits  BASIC METABOLIC PANEL - Abnormal; Notable for the following components:   Glucose, Bld 130 (*)    BUN 21 (*)    All other components within normal limits  CBC     RADIOLOGY CT renal scan per radiologist mild left hydronephrosis  secondary to a 4 x 6 mm stone in the proximal left ureter.  PROCEDURES:  Critical Care performed:   Procedures   MEDICATIONS ORDERED IN ED: Medications  ondansetron (ZOFRAN) injection 4 mg (4 mg Intravenous Given 05/27/22 1041)  ketorolac (TORADOL) 30 MG/ML injection 30 mg (30 mg Intravenous Given 05/27/22 1052)     IMPRESSION / MDM / ASSESSMENT AND PLAN / ED COURSE  I reviewed the triage vital signs and the nursing notes.   Differential diagnosis includes, but is not limited to, urinary tract infection, kidney stone,  56 year old male presents to the ED with complaint of sudden onset of left flank pain this morning at 6 AM.  Urinalysis is suggestive of a kidney stone with some WBCs also noted.  CT scan shows a 4 x 6 mm stone on the left.  Patient was given Toradol and Zofran while in the ED.  A  prescription for Cipro, Zofran and oxycodone was sent to the pharmacy.  Patient is to follow-up with [facility] neurology who is on-call for unassigned today.      Patient's presentation is most consistent with acute complicated illness / injury requiring diagnostic workup.  FINAL CLINICAL IMPRESSION(S) / ED DIAGNOSES   Final diagnoses:  Kidney stone     Rx / DC Orders   ED Discharge Orders          Ordered    tamsulosin (FLOMAX) 0.4 MG CAPS capsule  Daily        05/27/22 1047    oxyCODONE (OXY IR/ROXICODONE) 5 MG immediate release tablet  Every 6 hours PRN        05/27/22 1047    ciprofloxacin (CIPRO) 500 MG tablet  2 times daily        05/27/22 1233             Note:  This document was prepared using Dragon voice recognition software and may include unintentional dictation errors.   [name], PA-C 05/27/22 1519    Harvest Dark, MD 05/27/22 1539

## 2022-05-27 NOTE — ED Triage Notes (Signed)
C/O left flank pain since 0600.  States has history of kidney stones.

## 2022-05-27 NOTE — ED Provider Triage Note (Addendum)
Emergency Medicine Provider Triage Evaluation Note  Michael York , a 56 y.o. male  was evaluated in triage.  Pt complains of flank pain and history of multiple kidney stones. No fever, chills.  Took oxycodone '10mg'$  this am left over from 2021.    Review of Systems  Positive:  Negative:   Physical Exam  BP (!) 181/123 (BP Location: Left Arm)   Pulse 70   Temp 98.2 F (36.8 C)   Resp 20   Ht '5\' 9"'$  (1.753 m)   Wt 81.6 kg   SpO2 98%   BMI 26.57 kg/m  Gen:   Awake, no distress   Resp:  Normal effort  MSK:   Moves extremities without difficulty  Other:    Medical Decision Making  Medically screening exam initiated at 9:59 AM.  Appropriate orders placed.  Michael York was informed that the remainder of the evaluation will be completed by another provider, this initial triage assessment does not replace that evaluation, and the importance of remaining in the ED until their evaluation is complete.     [name], PA-C 05/27/22 1030    [name], PA-C 05/27/22 1031

## 2022-05-27 NOTE — ED Triage Notes (Signed)
Pt here with a possible kidney stone. Pt states he has a hx of recurrent kidney stones. Pt states he is nauseous and has been vomiting. Pt aching in triage in pain.

## 2022-05-27 NOTE — Discharge Instructions (Signed)
Call  [facility] urological and make an appointment.  Medications were sent to your pharmacy to begin taking.

## 2022-05-28 ENCOUNTER — Encounter: Payer: Self-pay | Admitting: Urology

## 2022-05-28 ENCOUNTER — Other Ambulatory Visit: Payer: Self-pay

## 2022-05-28 ENCOUNTER — Telehealth: Payer: Self-pay | Admitting: Family Medicine

## 2022-05-28 ENCOUNTER — Ambulatory Visit: Payer: BC Managed Care – PPO | Admitting: Urology

## 2022-05-28 VITALS — BP 158/96 | HR 93 | Ht 69.0 in | Wt 174.0 lb

## 2022-05-28 DIAGNOSIS — N201 Calculus of ureter: Secondary | ICD-10-CM

## 2022-05-28 DIAGNOSIS — N200 Calculus of kidney: Secondary | ICD-10-CM

## 2022-05-28 MED ORDER — KETOROLAC TROMETHAMINE 10 MG PO TABS: 10.0000 mg | ORAL_TABLET | Freq: Four times a day (QID) | ORAL | 0 refills | Status: AC | PRN

## 2022-05-28 MED ORDER — ONDANSETRON HCL 4 MG PO TABS: 4.0000 mg | ORAL_TABLET | Freq: Three times a day (TID) | ORAL | 0 refills | Status: AC | PRN

## 2022-05-28 NOTE — Telephone Encounter (Signed)
Pharmacy left message on triage line stating they will need a different pain medication to give to patient as he had this medication by IV yesterday. She stated it can cause cerebral bleeding. Please advise.

## 2022-05-28 NOTE — Patient Instructions (Addendum)
Do not take Toradol or any other anti-inflammatories/meloxicam or aspirin after Monday night   Kidney Stones Kidney stones are rock-like masses that form inside of the kidneys. Kidneys are organs that make pee (urine). A kidney stone may move into other parts of the urinary tract, including: The tubes that connect the kidneys to the bladder (ureters). The bladder. The tube that carries urine out of the body (urethra). Kidney stones can cause very bad pain and can block the flow of pee. The stone usually leaves your body (passes) through your pee. You may need to have a doctor take out the stone. What are the causes? Kidney stones may be caused by: A condition in which certain glands make too much parathyroid hormone (primary hyperparathyroidism). A buildup of a type of crystals in the bladder made of a chemical called uric acid. The body makes uric acid when you eat certain foods. Narrowing (stricture) of one or both of the ureters. A kidney blockage that you were born with. Past surgery on the kidney or the ureters, such as gastric bypass surgery. What increases the risk? You are more likely to develop this condition if: You have had a kidney stone in the past. You have a family history of kidney stones. You do not drink enough water. You eat a diet that is high in protein, salt (sodium), or sugar. You are overweight or very overweight (obese). What are the signs or symptoms? Symptoms of a kidney stone may include: Pain in the side of the belly, right below the ribs (flank pain). Pain usually spreads (radiates) to the groin. Needing to pee often or right away (urgently). Pain when going pee (urinating). Blood in your pee (hematuria). Feeling like you may vomit (nauseous). Vomiting. Fever and chills. How is this treated? Treatment depends on the size, location, and makeup of the kidney stones. The stones will often pass out of the body through peeing. You may need to: Drink more  fluid to help pass the stone. In some cases, you may be given fluids through an IV tube put into one of your veins at the hospital. Take medicine for pain. Make changes in your diet to help keep kidney stones from coming back. Sometimes, medical procedures are needed to remove a kidney stone. This may involve: A procedure to break up kidney stones using a beam of light (laser) or shock waves. Surgery to remove the kidney stones. Follow these instructions at home: Medicines Take over-the-counter and prescription medicines only as told by your doctor. Ask your doctor if the medicine prescribed to you requires you to avoid driving or using heavy machinery. Eating and drinking Drink enough fluid to keep your pee pale yellow. You may be told to drink at least 8-10 glasses of water each day. This will help you pass the stone. If told by your doctor, change your diet. This may include: Limiting how much salt you eat. Eating more fruits and vegetables. Limiting how much meat, poultry, fish, and eggs you eat. Follow instructions from your doctor about eating or drinking restrictions. General instructions Collect pee samples as told by your doctor. You may need to collect a pee sample: 24 hours after a stone comes out. 8-12 weeks after a stone comes out, and every 6-12 months after that. Strain your pee every time you pee (urinate), for as long as told. Use the strainer that your doctor recommends. Do not throw out the stone. Keep it so that it can be tested by your doctor.  Keep all follow-up visits as told by your doctor. This is important. You may need follow-up tests. How is this prevented? To prevent another kidney stone: Drink enough fluid to keep your pee pale yellow. This is the best way to prevent kidney stones. Eat healthy foods. Avoid certain foods as told by your doctor. You may be told to eat less protein. Stay at a healthy weight. Where to find more information [place] ([facility]): www.kidney.[facility]): www.urologyhealth.org Contact a doctor if: You have pain that gets worse or does not get better with medicine. Get help right away if: You have a fever or chills. You get very bad pain. You get new pain in your belly (abdomen). You pass out (faint). You cannot pee. Summary Kidney stones are rock-like masses that form inside of the kidneys. Kidney stones can cause very bad pain and can block the flow of pee. The stones will often pass out of the body through peeing. Drink enough fluid to keep your pee pale yellow. This information is not intended to replace advice given to you by your health care provider. Make sure you discuss any questions you have with your health care provider. Document Revised: 01/19/2021 Document Reviewed: 01/20/2021 Elsevier Patient Education  [place].  Dietary Guidelines to Help Prevent Kidney Stones Kidney stones are deposits of minerals and salts that form inside your kidneys. Your risk of developing kidney stones may be greater depending on your diet, your lifestyle, the medicines you take, and whether you have certain medical conditions. Most people can lower their risks of developing kidney stones by following these dietary guidelines. Your dietitian may give you more specific instructions depending on your overall health and the type of kidney stones you tend to develop. What are tips for following this plan? Reading food labels  Choose foods with "no salt added" or "low-salt" labels. Limit your salt (sodium) intake to less than 1,500 mg a day. Choose foods with calcium for each meal and snack. Try to eat about 300 mg of calcium at each meal. Foods that contain 200-500 mg of calcium a serving include: 8 oz (237 mL) of milk, calcium-fortifiednon-dairy milk, and calcium-fortifiedfruit juice. Calcium-fortified means that calcium has been added to these drinks. 8 oz (237 mL) of kefir,  yogurt, and soy yogurt. 4 oz (114 g) of tofu. 1 oz (28 g) of cheese. 1 cup (150 g) of dried figs. 1 cup (91 g) of cooked broccoli. One 3 oz (85 g) can of sardines or mackerel. Most people need 1,000-1,500 mg of calcium a day. Talk to your dietitian about how much calcium is recommended for you. Shopping Buy plenty of fresh fruits and vegetables. Most people do not need to avoid fruits and vegetables, even if these foods contain nutrients that may contribute to kidney stones. When shopping for convenience foods, choose: Whole pieces of fruit. Pre-made salads with dressing on the side. Low-fat fruit and yogurt smoothies. Avoid buying frozen meals or prepared deli foods. These can be high in sodium. Look for foods with live cultures, such as yogurt and kefir. Choose high-fiber grains, such as whole-wheat breads, oat bran, and wheat cereals. Cooking Do not add salt to food when cooking. Place a salt shaker on the table and allow each person to add their own salt to taste. Use vegetable protein, such as beans, textured vegetable protein (TVP), or tofu, instead of meat in pasta, casseroles, and soups. Meal planning Eat less salt, if told by  your dietitian. To do this: Avoid eating processed or pre-made food. Avoid eating fast food. Eat less animal protein, including cheese, meat, poultry, or fish, if told by your dietitian. To do this: Limit the number of times you have meat, poultry, fish, or cheese each week. Eat a diet free of meat at least 2 days a week. Eat only one serving each day of meat, poultry, fish, or seafood. When you prepare animal proteins, cut pieces into small portion sizes. For most meat and fish, one serving is about the size of the palm of your hand. Eat at least five servings of fresh fruits and vegetables each day. To do this: Keep fruits and vegetables on hand for snacks. Eat one piece of fruit or a handful of berries with breakfast. Have a salad and fruit at  lunch. Have two kinds of vegetables at dinner. You may be told to limit foods that are high in a substance called oxalate. These include: Spinach (cooked), rhubarb, beets, sweet potatoes, and Swiss chard. Peanuts. Potato chips, french fries, and baked potatoes with skin on. Nuts and nut products. Chocolate. If you regularly take a diuretic medicine, make sure to eat at least 1 or 2 servings of fruits or vegetables that are high in potassium each day. These include: Avocado. Banana. Orange, prune, carrot, or tomato juice. Baked potato. Cabbage. Beans and split peas. Lifestyle  Drink enough fluid to keep your urine pale yellow. This is the most important thing you can do. Spread your fluid intake throughout the day. If you drink alcohol: Limit how much you have to: 0-1 drink a day for women who are not pregnant. 0-2 drinks a day for men. Know how much alcohol is in your drink. In the [place] one drink equals one 12 oz bottle of beer (355 mL), one 5 oz glass of wine (148 mL), or one 1 oz glass of hard liquor (44 mL). Lose weight if told by your health care provider. Work with your dietitian to find an eating plan and weight loss strategies that work best for you. General information Talk to your health care provider and dietitian about taking daily supplements. Depending on your health and the cause of your kidney stones, you may be told: Do not take high-dose supplements of vitamin C (1,000 mg a day or more). To take a calcium supplement. To take a daily probiotic supplement. To take other supplements such as magnesium, fish oil, or vitamin B6. Take over-the-counter and prescription medicines only as told by your health care provider. These include supplements. What foods should I limit? Limit your intake of the following foods, or eat them as told by your dietitian. Vegetables Spinach. Rhubarb. Beets. Canned vegetables. Angie Fava. Olives. Baked potatoes with skin. Grains Wheat bran.  Baked goods. Salted crackers. Cereals high in sugar. Meats and other proteins Nuts. Nut butters. Large portions of meat, poultry, or fish. Salted, precooked, or cured meats, such as sausages, meat loaves, and hot dogs. Dairy Cheeses. Beverages Regular soft drinks. Regular vegetable juice. Seasonings and condiments Seasoning blends with salt. Salad dressings. Soy sauce. Ketchup. Barbecue sauce. Other foods Canned soups. Canned pasta sauce. Casseroles. Pizza. Lasagna. Frozen meals. Potato chips. [place] fries. The items listed above may not be a complete list of foods and beverages you should limit. Contact a dietitian for more information. What foods should I avoid? Talk to your dietitian about specific foods you should avoid based on the type of kidney stones you have and your overall health. Fruits  Grapefruit. The item listed above may not be a complete list of foods and beverages you should avoid. Contact a dietitian for more information. Summary Kidney stones are deposits of minerals and salts that form inside your kidneys. You can lower your risk of kidney stones by making changes to your diet. The most important thing you can do is drink enough fluid. Drink enough fluid to keep your urine pale yellow. Talk to your dietitian about how much calcium you should have each day, and eat less salt and animal protein as told by your dietitian. This information is not intended to replace advice given to you by your health care provider. Make sure you discuss any questions you have with your health care provider. Document Revised: 08/28/2021 Document Reviewed: 08/28/2021 Elsevier Patient Education  [place].  ESWL for Kidney Stones  Extracorporeal shock wave lithotripsy (ESWL) is a treatment that can help break up kidney stones that are too large to pass on their own.  This is a nonsurgical procedure that breaks up a kidney stone with shock waves. These shock waves pass through your  body and focus on the kidney stone. They cause the kidney stone to break into smaller pieces (fragments) while it is still in the urinary tract. The fragments of stone can pass more easily out of your body in the urine. Tell a health care provider about: Any allergies you have. All medicines you are taking, including vitamins, herbs, eye drops, creams, and over-the-counter medicines. Any problems you or family members have had with anesthetic medicines. Any bleeding problems you have. Any surgeries you have had. Any medical conditions you have. Whether you are pregnant or may be pregnant. What are the risks? Your health care provider will talk with you about risks. These may include: Infection. Bleeding from the kidney. Bruising of the kidney or skin. Scarring of the kidney. This can lead to: Increased blood pressure. Poor kidney function. Return (recurrence) of kidney stones. Damage to other structures or organs. This may include the liver, colon, spleen, or pancreas. Blockage (obstruction) of the tube that carries urine from the kidney to the bladder (ureter). Failure of the kidney stone to break into fragments. What happens before the procedure? When to stop eating and drinking Follow instructions from your health care provider about what you may eat and drink. These may include: 8 hours before your procedure Stop eating most foods. Do not eat meat, fried foods, or fatty foods. Eat only light foods, such as toast or crackers. All liquids are okay except energy drinks and alcohol. 6 hours before your procedure Stop eating. Drink only clear liquids, such as water, clear fruit juice, black coffee, plain tea, and sports drinks. Do not drink energy drinks or alcohol. 2 hours before your procedure Stop drinking all liquids. You may be allowed to take medicines with small sips of water. If you do not follow your health care provider's instructions, your procedure may be delayed or  canceled. Medicines Ask your health care provider about: Changing or stopping your regular medicines. These include any diabetes medicines or blood thinners you take. Taking medicines such as aspirin and ibuprofen. These medicines can thin your blood. Do not take them unless your health care provider tells you to. Taking over-the-counter medicines, vitamins, herbs, and supplements. Tests You may have tests, such as: Blood tests. Urine tests. Imaging tests. This may include a CT scan. Surgery safety Ask your health care provider: How your surgery site will be marked. What steps  will be taken to help prevent infection. These steps may include: Washing skin with a soap that kills germs. Receiving antibiotics. General instructions If you will be going home right after the procedure, plan to have a responsible adult: Take you home from the hospital or clinic. You will not be allowed to drive. Care for you for the time you are told. What happens during the procedure?  An IV will be inserted into one of your veins. You may be given: A sedative. This helps you relax. Anesthesia. This will: Numb certain areas of your body. Make you fall asleep for surgery. A water-filled cushion may be placed behind your kidney or on your abdomen. In some cases, you may be placed in a tub of lukewarm water. Your body will be positioned in a way that makes it easier to target the kidney stone. An X-ray or ultrasound exam will be done to locate your stone. Shock waves will be aimed at the stone. If you are awake, you may feel a tapping sensation as the shock waves pass through your body. A small mesh tube (stent) may be placed in your ureter. This will help keep urine flowing from the kidney if the fragments of the stone have been blocking the ureter. The stent will be removed at a later time by your health care provider. The procedure may vary among health care providers and hospitals. What happens after the  procedure? Your blood pressure, heart rate, breathing rate, and blood oxygen level will be monitored until you leave the hospital or clinic. You may have an X-ray after the procedure to see how many of the kidney stones were broken up. This will also show how much of the stone has passed. If there are still large fragments after treatment, you may need to have a second procedure at a later time. This information is not intended to replace advice given to you by your health care provider. Make sure you discuss any questions you have with your health care provider. Document Revised: 09/18/2021 Document Reviewed: 09/18/2021 Elsevier Patient Education  [place].

## 2022-05-28 NOTE — Telephone Encounter (Signed)
Called [facility] multiple times, no answer. LM on the provider line informing pharmacy staff that per [facility] ok to fill oral Toradol. Pt can begin tomorrow if need be. Message giving the ok [facility]ed to [facility]. Advised [facility] call back for questions or concerns.
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
## 2022-05-29 ENCOUNTER — Other Ambulatory Visit: Payer: Self-pay | Admitting: Urology

## 2022-05-29 ENCOUNTER — Telehealth: Payer: Self-pay | Admitting: Urology

## 2022-05-29 MED ORDER — OXYCODONE HCL 10 MG PO TABS: 10.0000 mg | ORAL_TABLET | Freq: Four times a day (QID) | ORAL | 0 refills | Status: DC | PRN

## 2022-05-29 NOTE — Telephone Encounter (Signed)
Patient called in with ongoing left-sided flank pain with known 6 mm left proximal ureteral stone.  Denies fevers or chills.  Toradol has not been as effective as the 10 mg oxycodone.  I refilled the 10 mg oxycodone dose x 30 tabs.  Encouraged him to take Tylenol and Aleve scheduled through the weekend.  He prefers a trial of medical expulsive therapy, and if unable to pass the stone prefers shockwave lithotripsy.  We only have access to that equipment on Thursdays.  He has a clinic appointment with me early next week to set up shockwave lithotripsy 06/04/2022 if stone has not passed.  Nickolas Madrid, MD 05/29/2022

## 2022-06-03 ENCOUNTER — Ambulatory Visit: Payer: BC Managed Care – PPO | Admitting: Urology

## 2022-06-03 ENCOUNTER — Ambulatory Visit
Admission: RE | Admit: 2022-06-03 | Discharge: 2022-06-03 | Disposition: A | Discharge status: Home / Self Care | Payer: BC Managed Care – PPO | Source: Ambulatory Visit | Attending: Urology | Admitting: Urology

## 2022-06-03 ENCOUNTER — Other Ambulatory Visit: Payer: Self-pay | Admitting: Urology

## 2022-06-03 ENCOUNTER — Encounter: Payer: Self-pay | Admitting: Urology

## 2022-06-03 ENCOUNTER — Ambulatory Visit
Admission: RE | Admit: 2022-06-03 | Discharge: 2022-06-03 | Disposition: A | Discharge status: Home / Self Care | Payer: BC Managed Care – PPO | Attending: Urology | Admitting: Urology

## 2022-06-03 VITALS — BP 158/90 | HR 93

## 2022-06-03 DIAGNOSIS — N200 Calculus of kidney: Secondary | ICD-10-CM

## 2022-06-03 DIAGNOSIS — N201 Calculus of ureter: Secondary | ICD-10-CM | POA: Diagnosis not present

## 2022-06-03 MED ORDER — ONDANSETRON HCL 4 MG/2ML IJ SOLN: 4.0000 mg | Freq: Once | INTRAMUSCULAR | Status: DC

## 2022-06-03 MED ORDER — DIAZEPAM 5 MG PO TABS: 10.0000 mg | ORAL_TABLET | ORAL | Status: DC

## 2022-06-03 MED ORDER — SODIUM CHLORIDE 0.9 % IV SOLN: INTRAVENOUS | Status: DC

## 2022-06-03 MED ORDER — CEPHALEXIN 500 MG PO CAPS: 500.0000 mg | ORAL_CAPSULE | Freq: Once | ORAL | Status: DC

## 2022-06-03 MED ORDER — DIPHENHYDRAMINE HCL 25 MG PO CAPS: 25.0000 mg | ORAL_CAPSULE | ORAL | Status: DC

## 2022-06-03 MED ORDER — OXYCODONE HCL 10 MG PO TABS: 10.0000 mg | ORAL_TABLET | ORAL | 0 refills | Status: AC | PRN

## 2022-06-03 NOTE — Patient Instructions (Signed)
ESWL for Kidney Stones  Extracorporeal shock wave lithotripsy (ESWL) is a treatment that can help break up kidney stones that are too large to pass on their own.  This is a nonsurgical procedure that breaks up a kidney stone with shock waves. These shock waves pass through your body and focus on the kidney stone. They cause the kidney stone to break into smaller pieces (fragments) while it is still in the urinary tract. The fragments of stone can pass more easily out of your body in the urine. Tell a health care provider about: Any allergies you have. All medicines you are taking, including vitamins, herbs, eye drops, creams, and over-the-counter medicines. Any problems you or family members have had with anesthetic medicines. Any bleeding problems you have. Any surgeries you have had. Any medical conditions you have. Whether you are pregnant or may be pregnant. What are the risks? Your health care provider will talk with you about risks. These may include: Infection. Bleeding from the kidney. Bruising of the kidney or skin. Scarring of the kidney. This can lead to: Increased blood pressure. Poor kidney function. Return (recurrence) of kidney stones. Damage to other structures or organs. This may include the liver, colon, spleen, or pancreas. Blockage (obstruction) of the tube that carries urine from the kidney to the bladder (ureter). Failure of the kidney stone to break into fragments. What happens before the procedure? When to stop eating and drinking Follow instructions from your health care provider about what you may eat and drink. These may include: 8 hours before your procedure Stop eating most foods. Do not eat meat, fried foods, or fatty foods. Eat only light foods, such as toast or crackers. All liquids are okay except energy drinks and alcohol. 6 hours before your procedure Stop eating. Drink only clear liquids, such as water, clear fruit juice, black coffee, plain tea,  and sports drinks. Do not drink energy drinks or alcohol. 2 hours before your procedure Stop drinking all liquids. You may be allowed to take medicines with small sips of water. If you do not follow your health care provider's instructions, your procedure may be delayed or canceled. Medicines Ask your health care provider about: Changing or stopping your regular medicines. These include any diabetes medicines or blood thinners you take. Taking medicines such as aspirin and ibuprofen. These medicines can thin your blood. Do not take them unless your health care provider tells you to. Taking over-the-counter medicines, vitamins, herbs, and supplements. Tests You may have tests, such as: Blood tests. Urine tests. Imaging tests. This may include a CT scan. Surgery safety Ask your health care provider: How your surgery site will be marked. What steps will be taken to help prevent infection. These steps may include: Washing skin with a soap that kills germs. Receiving antibiotics. General instructions If you will be going home right after the procedure, plan to have a responsible adult: Take you home from the hospital or clinic. You will not be allowed to drive. Care for you for the time you are told. What happens during the procedure?  An IV will be inserted into one of your veins. You may be given: A sedative. This helps you relax. Anesthesia. This will: Numb certain areas of your body. Make you fall asleep for surgery. A water-filled cushion may be placed behind your kidney or on your abdomen. In some cases, you may be placed in a tub of lukewarm water. Your body will be positioned in a way that makes it   easier to target the kidney stone. An X-ray or ultrasound exam will be done to locate your stone. Shock waves will be aimed at the stone. If you are awake, you may feel a tapping sensation as the shock waves pass through your body. A small mesh tube (stent) may be placed in your  ureter. This will help keep urine flowing from the kidney if the fragments of the stone have been blocking the ureter. The stent will be removed at a later time by your health care provider. The procedure may vary among health care providers and hospitals. What happens after the procedure? Your blood pressure, heart rate, breathing rate, and blood oxygen level will be monitored until you leave the hospital or clinic. You may have an X-ray after the procedure to see how many of the kidney stones were broken up. This will also show how much of the stone has passed. If there are still large fragments after treatment, you may need to have a second procedure at a later time. This information is not intended to replace advice given to you by your health care provider. Make sure you discuss any questions you have with your health care provider. Document Revised: 09/18/2021 Document Reviewed: 09/18/2021 Elsevier Patient Education  2023 [employer]  

## 2022-06-03 NOTE — Progress Notes (Addendum)
   06/03/2022 10:20 AM   Michael York 1965-07-02 [id number]  Reason for visit: Follow up left ureteral stone, renal colic  HPI: 57 year old male with extensive history of prior kidney stones requiring lithotripsy at least 10 times in the past, as well as ureteroscopy in 2016.  He was previously followed by Dr. Yves Dill.  He presented on 05/27/2022 with severe left-sided flank pain, and CT showed a 6 mm left proximal ureteral stone.  He has been on medical also therapy since that time, and is continue to have fairly significant left-sided flank pain despite 10 mg oxycodone every 4 hours.  He did not have significant improvement on the Toradol.  His pain has moved down lower on the left side.  He is strongly interested in definitive management.  I personally viewed and interpreted the KUB today that appears to show migration of the 6 mm spherical stone to the distal ureter and can be seen more medially.  We reviewed options including continuing medical expulsive therapy, shockwave lithotripsy, or ureteroscopy.  He is interested in pursuing shockwave lithotripsy tomorrow.  Risks and benefits discussed at length including bleeding, infection, obstructive fragments/postop pain/Steinstrasse, potential need for additional procedures.  Schedule shockwave lithotripsy tomorrow for left distal ureteral stone Oxycodone refilled Encouraged to strain urine Will need 67-PCHE urine metabolic workup at follow-up   [facility], [place] [address], [place], [place] [id number] [phone]

## 2022-06-03 NOTE — Progress Notes (Signed)
ESWL ORDER FORM  Expected date of procedure: 06/03/22  Surgeon: John Giovanni, MD  Post op standing: 2-4wk follow up w/KUB prior  Anticoagulation/Aspirin/NSAID standing order: Hold all 72 hours prior  Anesthesia standing order: MAC  VTE standing: SCD's  Dx: Left Ureteral Stone  Procedure: left Extracorporeal shock wave lithotripsy  CPT : 88502  Standing Order Set:   *NPO after mn, KUB  *NS 186m/hr, Keflex 5079mPO, Benadryl 2548mO, Valium 41m20m, Zofran 4mg 46m   Medications if other than standing orders:   NONE

## 2022-06-04 ENCOUNTER — Other Ambulatory Visit: Payer: Self-pay | Admitting: Family Medicine

## 2022-06-04 ENCOUNTER — Encounter: Admission: RE | Payer: Self-pay | Source: Home / Self Care

## 2022-06-04 ENCOUNTER — Ambulatory Visit
Admission: RE | Admit: 2022-06-04 | Discharge: 2022-06-04 | Disposition: A | Discharge status: Home / Self Care | Payer: BC Managed Care – PPO | Source: Ambulatory Visit | Attending: Urology | Admitting: Urology

## 2022-06-04 ENCOUNTER — Ambulatory Visit: Admission: RE | Admit: 2022-06-04 | Payer: BC Managed Care – PPO | Source: Home / Self Care | Admitting: Urology

## 2022-06-04 ENCOUNTER — Ambulatory Visit
Admission: RE | Admit: 2022-06-04 | Discharge: 2022-06-04 | Disposition: A | Discharge status: Home / Self Care | Payer: BC Managed Care – PPO | Attending: Urology | Admitting: Urology

## 2022-06-04 DIAGNOSIS — N201 Calculus of ureter: Secondary | ICD-10-CM

## 2022-06-04 SURGERY — LITHOTRIPSY, ESWL
Anesthesia: Moderate Sedation | Laterality: Left

## 2022-06-05 ENCOUNTER — Other Ambulatory Visit: Payer: BC Managed Care – PPO

## 2022-06-08 ENCOUNTER — Other Ambulatory Visit: Payer: BC Managed Care – PPO

## 2022-06-08 ENCOUNTER — Encounter: Payer: Self-pay | Admitting: Physician Assistant

## 2022-06-26 ENCOUNTER — Ambulatory Visit: Payer: BC Managed Care – PPO | Admitting: Physician Assistant

## 2022-07-09 ENCOUNTER — Ambulatory Visit: Payer: BC Managed Care – PPO | Admitting: Urology
